# Patient Record
Sex: Male | Born: 1985 | Race: White | Hispanic: No | Marital: Married | Smoking: Never smoker
Health system: Southern US, Community
[De-identification: ages and names within clinical notes are randomized; demographics above are authoritative.]

## PROBLEM LIST (undated history)

## (undated) DIAGNOSIS — Z789 Other specified health status: Secondary | ICD-10-CM

---
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
## 2005-04-03 ENCOUNTER — Emergency Department (HOSPITAL_COMMUNITY): Admission: EM | Admit: 2005-04-03 | Discharge: 2005-04-03 | Payer: Self-pay | Admitting: Emergency Medicine

## 2009-10-05 ENCOUNTER — Emergency Department (HOSPITAL_COMMUNITY): Admission: EM | Admit: 2009-10-05 | Discharge: 2009-10-06 | Payer: Self-pay | Admitting: Emergency Medicine

## 2010-07-17 NOTE — Discharge Summary (Signed)
NAME:  Cody [name], Cody [name]                          ACCOUNT NO.:  [id number]   MEDICAL RECORD NO.:  [id number]                   PATIENT TYPE:  INP   LOCATION:  A307                                 FACILITY:  [facility]   PHYSICIAN:  Scott A. Gerda Diss, M.D.               DATE OF BIRTH:  1985-03-31   DATE OF ADMISSION:  08/09/2003  DATE OF DISCHARGE:                                 DISCHARGE SUMMARY   HOSPITAL COURSE:  The patient was brought into the hospital for outpatient  fluids.  This could not be done through the emergency department, so he was  put in observation. He was given IV fluids and had blood work done. His  blood work overall looked normal.  WBC was 6.5 and hemoglobin was 14.9.  Sodium 140, potassium 4.1, BUN 3.0 and the creatinine was 0.9.  The patient  was brought in for these fluids because of orthostasis secondary to 24 hours  of gastroenteritis.  It is felt that the patient would respond well to IV  fluids and in fact his overall clinical picture was so much better at the  time of discharge.  It was felt that he could home on Phenergan and he was  given a prescription for this.  Unfortunately, this short stay could not be  done through the ER because of their policy towards of not doing this type  of stuff, so therefore it was done here on the floor.     ___________________________________________                                         Jonna Coup Gerda Diss, M.D.   SAL/MEDQ  D:  08/09/2003  T:  08/10/2003  Job:  [id number]

## 2011-05-12 ENCOUNTER — Other Ambulatory Visit: Payer: Self-pay | Admitting: Neurosurgery

## 2011-05-14 ENCOUNTER — Encounter (HOSPITAL_COMMUNITY)
Admission: RE | Admit: 2011-05-14 | Discharge: 2011-05-14 | Disposition: A | Discharge status: Home / Self Care | Payer: BC Managed Care – PPO | Source: Ambulatory Visit | Attending: Neurosurgery | Admitting: Neurosurgery

## 2011-05-14 ENCOUNTER — Encounter (HOSPITAL_COMMUNITY): Payer: Self-pay | Admitting: Respiratory Therapy

## 2011-05-14 ENCOUNTER — Encounter (HOSPITAL_COMMUNITY): Payer: Self-pay

## 2011-05-14 HISTORY — DX: Other specified health status: Z78.9

## 2011-05-14 LAB — URINALYSIS, ROUTINE W REFLEX MICROSCOPIC
Bilirubin Urine: NEGATIVE
Hgb urine dipstick: NEGATIVE
Protein, ur: NEGATIVE mg/dL
Urobilinogen, UA: 0.2 mg/dL (ref 0.0–1.0)

## 2011-05-14 LAB — DIFFERENTIAL
Basophils Relative: 1 % (ref 0–1)
Eosinophils Absolute: 0.1 10*3/uL (ref 0.0–0.7)
Eosinophils Relative: 1 % (ref 0–5)
Monocytes Absolute: 0.8 10*3/uL (ref 0.1–1.0)
Monocytes Relative: 9 % (ref 3–12)

## 2011-05-14 LAB — CBC
HCT: 43.8 % (ref 39.0–52.0)
Hemoglobin: 15.2 g/dL (ref 13.0–17.0)
MCH: 29.5 pg (ref 26.0–34.0)
MCHC: 34.7 g/dL (ref 30.0–36.0)
MCV: 84.9 fL (ref 78.0–100.0)

## 2011-05-14 LAB — BASIC METABOLIC PANEL
BUN: 12 mg/dL (ref 6–23)
GFR calc non Af Amer: >90 mL/min (ref >90)
Glucose, Bld: 87 mg/dL (ref 70–99)
Potassium: 4.1 mEq/L (ref 3.5–5.1)

## 2011-05-14 NOTE — Pre-Procedure Instructions (Signed)
20 JAREE TRINKA  05/14/2011   Your procedure is scheduled on:  Tues,Mar 19 @ 2:50pm  Report to [facility] at 1145am   Call this number if you have problems the morning of surgery: [phone]   Remember:   Do not eat food:After Midnight.  May have clear liquids: up to 4 Hours before arrival.(until 7:45 am)  Clear liquids include soda, tea, black coffee, apple or grape juice, broth,water  Take these medicines the morning of surgery with A SIP OF WATER:    Do not wear jewelry, make-up or nail polish.  Do not wear lotions, powders, or perfumes.   Do not bring valuables to the hospital.  Contacts, dentures or bridgework may not be worn into surgery.  Leave suitcase in the car. After surgery it may be brought to your room.  For patients admitted to the hospital, checkout time is 11:00 AM the day of discharge.   Patients discharged the day of surgery will not be allowed to drive home.    Special Instructions: CHG Shower Use Special Wash: 1/2 bottle night before surgery and 1/2 bottle morning of surgery.   Please read over the following fact sheets that you were given: Pain Booklet, Coughing and Deep Breathing, MRSA Information and Surgical Site Infection Prevention

## 2011-05-14 NOTE — Pre-Procedure Instructions (Signed)
20 Cody [name]  05/14/2011   Your procedure is scheduled on: Tuesday, March 19,2013  Report to [facility] at 11:45AM.  Call this number if you have problems the morning of surgery: [phone]   Remember:   Do not eat food:After Midnight.  May have clear liquids: up to 4 Hours before arrival. (7:45)  Clear liquids include soda, tea, black coffee, apple or grape juice, broth.  Take these medicines the morning of surgery with A SIP OF WATER:  hydrocodone   Do not wear jewelry, make-up or nail polish.  Do not wear lotions, powders, or perfumes. You may wear deodorant.  Do not shave 48 hours prior to surgery.  Do not bring valuables to the hospital.  Contacts, dentures or bridgework may not be worn into surgery.  Leave suitcase in the car. After surgery it may be brought to your room.  For patients admitted to the hospital, checkout time is 11:00 AM the day of discharge.   Patients discharged the day of surgery will not be allowed to drive home.  Name and phone number of your driver: Orie Fisherman [phone]  Special Instructions: CHG Shower Use Special Wash: 1/2 bottle night before surgery and 1/2 bottle morning of surgery.   Please read over the following fact sheets that you were given: Pain Booklet, Coughing and Deep Breathing, MRSA Information and Surgical Site Infection Prevention

## 2011-05-17 MED ORDER — CEFAZOLIN SODIUM 1-5 GM-% IV SOLN
1.0000 g | INTRAVENOUS | Status: AC
  Administered 2011-05-18: 1 g via INTRAVENOUS
  Filled 2011-05-17: qty 50

## 2011-05-18 ENCOUNTER — Encounter (HOSPITAL_COMMUNITY)
Admission: RE | Disposition: A | Discharge status: Home / Self Care | Payer: Self-pay | Source: Ambulatory Visit | Attending: Neurosurgery

## 2011-05-18 ENCOUNTER — Ambulatory Visit (HOSPITAL_COMMUNITY)
Admission: RE | Admit: 2011-05-18 | Discharge: 2011-05-18 | Disposition: A | Discharge status: Home / Self Care | Payer: BC Managed Care – PPO | Source: Ambulatory Visit | Attending: Neurosurgery | Admitting: Neurosurgery

## 2011-05-18 ENCOUNTER — Encounter (HOSPITAL_COMMUNITY): Payer: Self-pay | Admitting: Anesthesiology

## 2011-05-18 ENCOUNTER — Ambulatory Visit (HOSPITAL_COMMUNITY): Payer: BC Managed Care – PPO

## 2011-05-18 ENCOUNTER — Encounter (HOSPITAL_COMMUNITY): Payer: Self-pay | Admitting: *Deleted

## 2011-05-18 ENCOUNTER — Ambulatory Visit (HOSPITAL_COMMUNITY): Payer: BC Managed Care – PPO | Admitting: Anesthesiology

## 2011-05-18 DIAGNOSIS — M5126 Other intervertebral disc displacement, lumbar region: Secondary | ICD-10-CM | POA: Insufficient documentation

## 2011-05-18 DIAGNOSIS — Z01812 Encounter for preprocedural laboratory examination: Secondary | ICD-10-CM | POA: Insufficient documentation

## 2011-05-18 HISTORY — PX: LUMBAR LAMINECTOMY/DECOMPRESSION MICRODISCECTOMY: SHX5026

## 2011-05-18 SURGERY — LUMBAR LAMINECTOMY/DECOMPRESSION MICRODISCECTOMY
Anesthesia: General | Site: Spine Lumbar | Laterality: Left | Wound class: Clean

## 2011-05-18 MED ORDER — EPHEDRINE SULFATE 50 MG/ML IJ SOLN
INTRAMUSCULAR | Status: DC | PRN
  Administered 2011-05-18: 10 mg via INTRAVENOUS

## 2011-05-18 MED ORDER — PROMETHAZINE HCL 25 MG PO TABS
12.5000 mg | ORAL_TABLET | ORAL | Status: DC | PRN
Start: 2011-05-18 — End: 2011-05-18

## 2011-05-18 MED ORDER — GLYCOPYRROLATE 0.2 MG/ML IJ SOLN
INTRAMUSCULAR | Status: DC | PRN
Start: 2011-05-18 — End: 2011-05-18
  Administered 2011-05-18: .4 mg via INTRAVENOUS

## 2011-05-18 MED ORDER — SODIUM CHLORIDE 0.9 % IV SOLN
INTRAVENOUS | Status: AC
  Filled 2011-05-18: qty 500

## 2011-05-18 MED ORDER — KETOROLAC TROMETHAMINE 30 MG/ML IJ SOLN
INTRAMUSCULAR | Status: AC
  Filled 2011-05-18: qty 1

## 2011-05-18 MED ORDER — ZOLPIDEM TARTRATE 5 MG PO TABS: 10.0000 mg | ORAL_TABLET | Freq: Every evening | ORAL | Status: DC | PRN

## 2011-05-18 MED ORDER — METHOCARBAMOL 500 MG PO TABS: 500.0000 mg | ORAL_TABLET | Freq: Four times a day (QID) | ORAL | Status: DC | PRN

## 2011-05-18 MED ORDER — MORPHINE SULFATE 4 MG/ML IJ SOLN: 1.0000 mg | INTRAMUSCULAR | Status: DC | PRN

## 2011-05-18 MED ORDER — DOCUSATE SODIUM 100 MG PO CAPS: 100.0000 mg | ORAL_CAPSULE | Freq: Two times a day (BID) | ORAL | Status: DC

## 2011-05-18 MED ORDER — CEFAZOLIN SODIUM 1-5 GM-% IV SOLN
1.0000 g | Freq: Three times a day (TID) | INTRAVENOUS | Status: DC
  Filled 2011-05-18 (×2): qty 50

## 2011-05-18 MED ORDER — SODIUM CHLORIDE 0.9 % IJ SOLN: 3.0000 mL | Freq: Two times a day (BID) | INTRAMUSCULAR | Status: DC

## 2011-05-18 MED ORDER — KCL IN DEXTROSE-NACL 20-5-0.45 MEQ/L-%-% IV SOLN
INTRAVENOUS | Status: DC
  Filled 2011-05-18 (×2): qty 1000

## 2011-05-18 MED ORDER — LACTATED RINGERS IV SOLN
INTRAVENOUS | Status: DC | PRN
  Administered 2011-05-18: 13:00:00 via INTRAVENOUS

## 2011-05-18 MED ORDER — HYDROMORPHONE HCL PF 1 MG/ML IJ SOLN
0.2500 mg | INTRAMUSCULAR | Status: DC | PRN
  Administered 2011-05-18 (×2): 0.5 mg via INTRAVENOUS

## 2011-05-18 MED ORDER — PROPOFOL 10 MG/ML IV BOLUS
INTRAVENOUS | Status: DC | PRN
  Administered 2011-05-18: 200 mg via INTRAVENOUS

## 2011-05-18 MED ORDER — DEXTROSE 5 % IV SOLN
500.0000 mg | Freq: Four times a day (QID) | INTRAVENOUS | Status: DC | PRN
  Filled 2011-05-18: qty 5

## 2011-05-18 MED ORDER — DEXAMETHASONE SODIUM PHOSPHATE 4 MG/ML IJ SOLN
INTRAMUSCULAR | Status: DC | PRN
  Administered 2011-05-18: 10 mg via INTRAVENOUS

## 2011-05-18 MED ORDER — OXYCODONE-ACETAMINOPHEN 5-325 MG PO TABS
1.0000 | ORAL_TABLET | ORAL | Status: DC | PRN
  Administered 2011-05-18: 2 via ORAL
  Filled 2011-05-18: qty 2

## 2011-05-18 MED ORDER — HYDROMORPHONE HCL PF 1 MG/ML IJ SOLN
INTRAMUSCULAR | Status: AC
  Filled 2011-05-18: qty 1

## 2011-05-18 MED ORDER — 0.9 % SODIUM CHLORIDE (POUR BTL) OPTIME
TOPICAL | Status: DC | PRN
  Administered 2011-05-18: 1000 mL

## 2011-05-18 MED ORDER — ONDANSETRON HCL 4 MG/2ML IJ SOLN: 4.0000 mg | INTRAMUSCULAR | Status: DC | PRN

## 2011-05-18 MED ORDER — HYDROCODONE-ACETAMINOPHEN 5-325 MG PO TABS: 1.0000 | ORAL_TABLET | ORAL | Status: DC | PRN

## 2011-05-18 MED ORDER — BACITRACIN 50000 UNITS IM SOLR
INTRAMUSCULAR | Status: AC
  Filled 2011-05-18: qty 1

## 2011-05-18 MED ORDER — LIDOCAINE-EPINEPHRINE 1 %-1:100000 IJ SOLN
INTRAMUSCULAR | Status: DC | PRN
  Administered 2011-05-18: 20 mL

## 2011-05-18 MED ORDER — BISACODYL 10 MG RE SUPP: 10.0000 mg | Freq: Every day | RECTAL | Status: DC | PRN

## 2011-05-18 MED ORDER — MAGNESIUM HYDROXIDE 400 MG/5ML PO SUSP: 30.0000 mL | Freq: Every day | ORAL | Status: DC | PRN

## 2011-05-18 MED ORDER — ACETAMINOPHEN 650 MG RE SUPP: 650.0000 mg | RECTAL | Status: DC | PRN

## 2011-05-18 MED ORDER — KETOROLAC TROMETHAMINE 30 MG/ML IJ SOLN
30.0000 mg | Freq: Four times a day (QID) | INTRAMUSCULAR | Status: DC
  Administered 2011-05-18: 30 mg via INTRAVENOUS

## 2011-05-18 MED ORDER — SODIUM CHLORIDE 0.9 % IJ SOLN: 3.0000 mL | INTRAMUSCULAR | Status: DC | PRN

## 2011-05-18 MED ORDER — SODIUM CHLORIDE 0.9 % IR SOLN
Status: DC | PRN
  Administered 2011-05-18: 14:00:00

## 2011-05-18 MED ORDER — ROCURONIUM BROMIDE 100 MG/10ML IV SOLN
INTRAVENOUS | Status: DC | PRN
  Administered 2011-05-18: 50 mg via INTRAVENOUS

## 2011-05-18 MED ORDER — MIDAZOLAM HCL 5 MG/5ML IJ SOLN
INTRAMUSCULAR | Status: DC | PRN
  Administered 2011-05-18: 2 mg via INTRAVENOUS

## 2011-05-18 MED ORDER — NEOSTIGMINE METHYLSULFATE 1 MG/ML IJ SOLN
INTRAMUSCULAR | Status: DC | PRN
  Administered 2011-05-18: 3 mg via INTRAVENOUS

## 2011-05-18 MED ORDER — FENTANYL CITRATE 0.05 MG/ML IJ SOLN
INTRAMUSCULAR | Status: DC | PRN
  Administered 2011-05-18: 250 ug via INTRAVENOUS

## 2011-05-18 MED ORDER — ONDANSETRON HCL 4 MG/2ML IJ SOLN: 4.0000 mg | Freq: Once | INTRAMUSCULAR | Status: DC | PRN

## 2011-05-18 MED ORDER — THROMBIN 5000 UNITS EX SOLR
CUTANEOUS | Status: DC | PRN
  Administered 2011-05-18 (×2): 5000 [IU] via TOPICAL

## 2011-05-18 MED ORDER — ONDANSETRON HCL 4 MG/2ML IJ SOLN
INTRAMUSCULAR | Status: DC | PRN
  Administered 2011-05-18: 4 mg via INTRAVENOUS

## 2011-05-18 MED ORDER — HEMOSTATIC AGENTS (NO CHARGE) OPTIME
TOPICAL | Status: DC | PRN
  Administered 2011-05-18: 1 via TOPICAL

## 2011-05-18 MED ORDER — CYCLOBENZAPRINE HCL 10 MG PO TABS: 10.0000 mg | ORAL_TABLET | Freq: Three times a day (TID) | ORAL | Status: DC | PRN

## 2011-05-18 MED ORDER — PROMETHAZINE HCL 25 MG/ML IJ SOLN: 12.5000 mg | INTRAMUSCULAR | Status: DC | PRN

## 2011-05-18 MED ORDER — ACETAMINOPHEN 325 MG PO TABS: 650.0000 mg | ORAL_TABLET | ORAL | Status: DC | PRN

## 2011-05-18 SURGICAL SUPPLY — 54 items
APL SKNCLS STERI-STRIP NONHPOA (GAUZE/BANDAGES/DRESSINGS) ×1
BAG DECANTER FOR FLEXI CONT (MISCELLANEOUS) ×2 IMPLANT
BENZOIN TINCTURE PRP APPL 2/3 (GAUZE/BANDAGES/DRESSINGS) ×2 IMPLANT
BLADE SURG ROTATE 9660 (MISCELLANEOUS) IMPLANT
BUR ACORN 6.0 ACORN (BURR) ×2 IMPLANT
BUR ACRON 5.0MM COATED (BURR) IMPLANT
CANISTER SUCTION 2500CC (MISCELLANEOUS) ×2 IMPLANT
CLOTH BEACON ORANGE TIMEOUT ST (SAFETY) ×2 IMPLANT
CONT SPEC 4OZ CLIKSEAL STRL BL (MISCELLANEOUS) IMPLANT
DRAPE LAPAROTOMY 100X72X124 (DRAPES) ×2 IMPLANT
DRAPE MICROSCOPE LEICA (MISCELLANEOUS) ×2 IMPLANT
DRAPE POUCH INSTRU U-SHP 10X18 (DRAPES) ×2 IMPLANT
DRAPE SURG 17X23 STRL (DRAPES) ×2 IMPLANT
DRESSING TELFA 8X3 (GAUZE/BANDAGES/DRESSINGS) ×2 IMPLANT
DURAPREP 26ML APPLICATOR (WOUND CARE) ×2 IMPLANT
ELECT REM PT RETURN 9FT ADLT (ELECTROSURGICAL) ×2
ELECTRODE REM PT RTRN 9FT ADLT (ELECTROSURGICAL) ×1 IMPLANT
GAUZE SPONGE 4X4 16PLY XRAY LF (GAUZE/BANDAGES/DRESSINGS) IMPLANT
GLOVE BIOGEL PI IND STRL 7.0 (GLOVE) ×1 IMPLANT
GLOVE BIOGEL PI INDICATOR 7.0 (GLOVE) ×1
GLOVE ECLIPSE 7.5 STRL STRAW (GLOVE) ×2 IMPLANT
GLOVE EXAM NITRILE LRG STRL (GLOVE) IMPLANT
GLOVE EXAM NITRILE MD LF STRL (GLOVE) IMPLANT
GLOVE EXAM NITRILE XL STR (GLOVE) IMPLANT
GLOVE EXAM NITRILE XS STR PU (GLOVE) IMPLANT
GLOVE SURG SS PI 6.5 STRL IVOR (GLOVE) ×4 IMPLANT
GOWN BRE IMP SLV AUR LG STRL (GOWN DISPOSABLE) ×4 IMPLANT
GOWN BRE IMP SLV AUR XL STRL (GOWN DISPOSABLE) ×2 IMPLANT
GOWN STRL REIN 2XL LVL4 (GOWN DISPOSABLE) IMPLANT
KIT BASIN OR (CUSTOM PROCEDURE TRAY) ×2 IMPLANT
KIT ROOM TURNOVER OR (KITS) ×2 IMPLANT
NEEDLE HYPO 18GX1.5 BLUNT FILL (NEEDLE) IMPLANT
NEEDLE HYPO 22GX1.5 SAFETY (NEEDLE) ×4 IMPLANT
NEEDLE SPNL 22GX3.5 QUINCKE BK (NEEDLE) ×2 IMPLANT
NS IRRIG 1000ML POUR BTL (IV SOLUTION) ×2 IMPLANT
PACK LAMINECTOMY NEURO (CUSTOM PROCEDURE TRAY) ×2 IMPLANT
PAD ARMBOARD 7.5X6 YLW CONV (MISCELLANEOUS) ×6 IMPLANT
PATTIES SURGICAL .75X.75 (GAUZE/BANDAGES/DRESSINGS) ×2 IMPLANT
RUBBERBAND STERILE (MISCELLANEOUS) ×4 IMPLANT
SPONGE GAUZE 4X4 12PLY (GAUZE/BANDAGES/DRESSINGS) ×2 IMPLANT
SPONGE LAP 4X18 X RAY DECT (DISPOSABLE) IMPLANT
SPONGE SURGIFOAM ABS GEL SZ50 (HEMOSTASIS) ×2 IMPLANT
STRIP CLOSURE SKIN 1/2X4 (GAUZE/BANDAGES/DRESSINGS) ×2 IMPLANT
SUT PROLENE 6 0 BV (SUTURE) IMPLANT
SUT VIC AB 0 CT1 18XCR BRD8 (SUTURE) ×1 IMPLANT
SUT VIC AB 0 CT1 8-18 (SUTURE) ×1
SUT VIC AB 2-0 CP2 18 (SUTURE) ×2 IMPLANT
SUT VIC AB 3-0 SH 8-18 (SUTURE) ×2 IMPLANT
SYR 20CC LL (SYRINGE) ×2 IMPLANT
SYR 5ML LL (SYRINGE) IMPLANT
TAPE CLOTH SURG 4X10 WHT LF (GAUZE/BANDAGES/DRESSINGS) ×2 IMPLANT
TOWEL OR 17X24 6PK STRL BLUE (TOWEL DISPOSABLE) ×2 IMPLANT
TOWEL OR 17X26 10 PK STRL BLUE (TOWEL DISPOSABLE) ×2 IMPLANT
WATER STERILE IRR 1000ML POUR (IV SOLUTION) ×2 IMPLANT

## 2011-05-18 NOTE — Anesthesia Preprocedure Evaluation (Signed)
Anesthesia Evaluation  Patient identified by MRN, date of birth, ID band Patient awake    Reviewed: Allergy & Precautions, H&P , NPO status , Patient's Chart, lab work & pertinent test results  Airway Mallampati: II      Dental  (+) Teeth Intact   Pulmonary  breath sounds clear to auscultation        Cardiovascular Rhythm:Regular Rate:Normal     Neuro/Psych    GI/Hepatic   Endo/Other    Renal/GU      Musculoskeletal   Abdominal   Peds  Hematology   Anesthesia Other Findings   Reproductive/Obstetrics                           Anesthesia Physical Anesthesia Plan  ASA: I  Anesthesia Plan: General   Post-op Pain Management:    Induction: Intravenous  Airway Management Planned: Oral ETT  Additional Equipment:   Intra-op Plan:   Post-operative Plan: Extubation in OR  Informed Consent: I have reviewed the patients History and Physical, chart, labs and discussed the procedure including the risks, benefits and alternatives for the proposed anesthesia with the patient or authorized representative who has indicated his/her understanding and acceptance.     Plan Discussed with: Surgeon and CRNA  Anesthesia Plan Comments: (HNP L5-S1)        Anesthesia Quick Evaluation

## 2011-05-18 NOTE — Interval H&P Note (Signed)
History and Physical Interval Note:  05/18/2011 12:07 PM  Cody [name]  has presented today for surgery, with the diagnosis of Lumbar hnp without myelopathy, Lumbar radiculopathy  The various methods of treatment have been discussed with the patient and family. After consideration of risks, benefits and other options for treatment, the patient has consented to  Procedure(s) (LRB): LUMBAR LAMINECTOMY/DECOMPRESSION MICRODISCECTOMY (Left) as a surgical intervention .  The patients' history has been reviewed, patient examined, no change in status, stable for surgery.  I have reviewed the patients' chart and labs.  Questions were answered to the patient's satisfaction.     [name] R

## 2011-05-18 NOTE — Discharge Summary (Signed)
Physician Discharge Summary  Patient ID: Cody [name] MRN: [id number] DOB/AGE: 1985/09/29 25 y.o.  Admit date: 05/18/2011 Discharge date: 05/18/2011  Admission Diagnoses:Lumbar herniated nucleus pulposus, Lumbar radiculopathy Left L5S1   Discharge Diagnoses: Lumbar herniated nucleus pulposus, Lumbar radiculopathy Left L5S1  Active Problems:  * No active hospital problems. *    Discharged Condition: good  Hospital Course: Patient admitted the day of surgery and underwent procedure below. Patient transferred from none to the floor up ambulating doing well less leg pain.  Consults: None  Significant Diagnostic Studies: none  Treatments: surgery: LUMBAR LAMINECTOMY/DECOMPRESSION MICRODISCECTOMY, Left L5S1 , microdisection   Discharge Exam: Blood pressure 116/77, pulse 84, temperature 98.3 F (36.8 C), temperature source Oral, resp. rate 18, SpO2 97.00%. Wound:c/d/i  Disposition: home   Medication List  As of 05/18/2011  2:28 PM   TAKE these medications         diclofenac 75 MG EC tablet   Commonly known as: VOLTAREN   Take 75 mg by mouth 2 (two) times daily.      HYDROcodone-acetaminophen 5-325 MG per tablet   Commonly known as: NORCO   Take 1 tablet by mouth every 6 (six) hours as needed. For pain      multivitamins ther. w/minerals Tabs   Take 1 tablet by mouth daily.             Signed: Clydene Fake, MD 05/18/2011, 2:28 PM

## 2011-05-18 NOTE — Anesthesia Postprocedure Evaluation (Signed)
  Anesthesia Post-op Note  Patient: Cody [name]  Procedure(s) Performed: Procedure(s) (LRB): LUMBAR LAMINECTOMY/DECOMPRESSION MICRODISCECTOMY (Left)  Patient Location: PACU  Anesthesia Type: General  Level of Consciousness: awake, alert  and oriented  Airway and Oxygen Therapy: Patient Spontanous Breathing and Patient connected to nasal cannula oxygen  Post-op Pain: mild  Post-op Assessment: Post-op Vital signs reviewed and Patient's Cardiovascular Status Stable  Post-op Vital Signs: stable  Complications: No apparent anesthesia complications

## 2011-05-18 NOTE — Op Note (Signed)
05/18/2011  2:22 PM  PATIENT:  Cody [name]  26 y.o. male  PRE-OPERATIVE DIAGNOSIS:  Lumbar herniated nucleus pulposus, Lumbar radiculopathy Left L5S1  POST-OPERATIVE DIAGNOSIS: same  PROCEDURE:  Procedure(s): LUMBAR LAMINECTOMY/DECOMPRESSION MICRODISCECTOMY, Left L5S1 , microdisection  SURGEON:  Surgeon(s): Clydene Fake, MD Karn Cassis, MD    ANESTHESIA:   general  EBL:   minimal  BLOOD ADMINISTERED:none  DRAINS: none   SPECIMEN:  No Specimen  DICTATION: Patient with back and left leg pain MRI showing large left-sided 51 disc herniation patient epidural injection which  helped but only lasted 3 days and patient decided to proceed with surgical intervention.  Patient brought into the operating room general anesthesia induced patient placed in a prone position with all pressure points padded the Wilson frame. Patient was prepped draped sterile fashion saber incision injected with 20 cc 1% lidocaine with epinephrine. He was placed in interspace x-rays obtained showing needles 0.51 interspace an incision was made centered with needle was incision taken the fascia hemostasis obtained with Bovie cauterization. Fascia was incised the Bovie and subperiosteal dissection done with L5 and S1 spinous process lamina to the facet something retractors placed markers placed in the interspace another x-ray was obtained confirming or positioning at L5-S1. Microscope was brought in for microdissection high-speed drill was used to starting hemilaminectomy medial facetectomy is completed with Kerrison punches foraminotomy was done over the S1 root. We then explored the epidural space a large disc herniation was seen under the nerve root disc space incised and discectomy done with pituitary rongeurs and curettes. We were finished we did decompression of the thecal sac and the S1 nerve root which bone fibered at its canal without compression. We are thick about solution we did hemostasis retractors  removed fascia closed with 0 Vicryl interrupted sutures subcutaneous tissue closed with 020 3-0 Vicryl interrupted sutures skin closed benzoin Steri-Strips dressing was placed patient placed back in supine position woken from anesthesia and transferred recovery room  PLAN OF CARE: Admit for overnight observation  PATIENT DISPOSITION:  PACU - hemodynamically stable.

## 2011-05-18 NOTE — Anesthesia Procedure Notes (Signed)
Procedure Name: Intubation Date/Time: 05/18/2011 12:54 PM Performed by: Marena Chancy Pre-anesthesia Checklist: Patient identified, Emergency Drugs available, Suction available and Patient being monitored Patient Re-evaluated:Patient Re-evaluated prior to inductionOxygen Delivery Method: Circle system utilized Preoxygenation: Pre-oxygenation with 100% oxygen Intubation Type: IV induction Ventilation: Mask ventilation without difficulty Laryngoscope Size: Miller and 2 Grade View: Grade I Number of attempts: 1 Secured at: 23 cm Tube secured with: Tape Dental Injury: Teeth and Oropharynx as per pre-operative assessment

## 2011-05-18 NOTE — Progress Notes (Signed)
Pt. Discharged as ordered. Pt. tolertating po pain meds. Ambulating and voiding adequate amt.of urine. Incision area CDI,with no S/S of infection. Discharged care notes, script,and instructions given to patient. Pt. And family stated understanding of instruction given.

## 2011-05-18 NOTE — Transfer of Care (Signed)
Immediate Anesthesia Transfer of Care Note  Patient: Cody [name]  Procedure(s) Performed: Procedure(s) (LRB): LUMBAR LAMINECTOMY/DECOMPRESSION MICRODISCECTOMY (Left)  Patient Location: PACU  Anesthesia Type: General  Level of Consciousness: awake, alert  and oriented  Airway & Oxygen Therapy: Patient Spontanous Breathing and Patient connected to nasal cannula oxygen  Post-op Assessment: Report given to PACU RN, Post -op Vital signs reviewed and stable and Patient moving all extremities X 4  Post vital signs: Reviewed and stable  Complications: No apparent anesthesia complications

## 2011-05-18 NOTE — H&P (Signed)
See H& P.

## 2011-05-18 NOTE — Preoperative (Signed)
Beta Blockers   Reason not to administer Beta Blockers:Not Applicable 

## 2011-05-19 ENCOUNTER — Encounter (HOSPITAL_COMMUNITY): Payer: Self-pay | Admitting: Neurosurgery

## 2012-09-23 ENCOUNTER — Encounter (HOSPITAL_COMMUNITY): Payer: Self-pay | Admitting: Emergency Medicine

## 2012-09-23 ENCOUNTER — Emergency Department (HOSPITAL_COMMUNITY)
Admission: EM | Admit: 2012-09-23 | Discharge: 2012-09-23 | Disposition: A | Discharge status: Home / Self Care | Payer: BC Managed Care – PPO | Attending: Emergency Medicine | Admitting: Emergency Medicine

## 2012-09-23 DIAGNOSIS — Z791 Long term (current) use of non-steroidal anti-inflammatories (NSAID): Secondary | ICD-10-CM | POA: Insufficient documentation

## 2012-09-23 DIAGNOSIS — S90569A Insect bite (nonvenomous), unspecified ankle, initial encounter: Secondary | ICD-10-CM | POA: Insufficient documentation

## 2012-09-23 DIAGNOSIS — W57XXXA Bitten or stung by nonvenomous insect and other nonvenomous arthropods, initial encounter: Secondary | ICD-10-CM

## 2012-09-23 DIAGNOSIS — L509 Urticaria, unspecified: Secondary | ICD-10-CM | POA: Insufficient documentation

## 2012-09-23 DIAGNOSIS — Y939 Activity, unspecified: Secondary | ICD-10-CM | POA: Insufficient documentation

## 2012-09-23 DIAGNOSIS — Y929 Unspecified place or not applicable: Secondary | ICD-10-CM | POA: Insufficient documentation

## 2012-09-23 DIAGNOSIS — Z79899 Other long term (current) drug therapy: Secondary | ICD-10-CM | POA: Insufficient documentation

## 2012-09-23 MED ORDER — DOXYCYCLINE HYCLATE 100 MG PO CAPS: 100.0000 mg | ORAL_CAPSULE | Freq: Two times a day (BID) | ORAL | Status: DC

## 2012-09-23 NOTE — ED Notes (Signed)
Pt c/o hives since Sunday, went to a [facility] and received prescription for Prednisone 10mg . Today was last day on medication but hives have not subsided. States they look worse than before. Pt pulled tick off of left calf on Monday, area is red and swollen, reports having pain "like shin splints" on bilateral extremities.

## 2012-09-23 NOTE — ED Notes (Signed)
MD at bedside. 

## 2012-09-23 NOTE — ED Provider Notes (Signed)
CSN: [id number]     Arrival date & time 09/23/12  0202 History     First MD Initiated Contact with Patient 09/23/12 0250     Chief Complaint  Patient presents with  . Urticaria  . Tick Removal   (Consider location/radiation/quality/duration/timing/severity/associated sxs/prior Treatment) HPI HPI Comments: Cody [name] is a 27 y.o. male who presents to the Emergency Department complaining of hives that began on Sunday. He was seen at a [facility] and placed on prednisone which he finished yesterday. This morning he woke up with hives. On Monday he was bitten by a small wood tick. He has no area of redness, fever, chills, rash from the tick bite.   PCP Dr. Luking  Past Medical History  Diagnosis Date  . No pertinent past medical history    Past Surgical History  Procedure Laterality Date  . Lumbar laminectomy/decompression microdiscectomy  05/18/2011    Procedure: LUMBAR LAMINECTOMY/DECOMPRESSION MICRODISCECTOMY;  Surgeon: James R Hirsch, MD;  Location: [facility];  Service: Neurosurgery;  Laterality: Left;  Left Lumbar Five-Sacral One Laminectomy/Diskectomy   No family history on file. History  Substance Use Topics  . Smoking status: Not on file  . Smokeless tobacco: Current User  . Alcohol Use: Yes     Comment: occasionally    Review of Systems  Constitutional: Negative for fever.       10  Systems reviewed and are negative for acute change except as noted in the HPI.  HENT: Negative for congestion.   Eyes: Negative for discharge and redness.  Respiratory: Negative for cough and shortness of breath.   Cardiovascular: Negative for chest pain.  Gastrointestinal: Negative for vomiting and abdominal pain.  Musculoskeletal: Negative for back pain.  Skin: Negative for rash.       Hives, tick bite  Neurological: Negative for syncope, numbness and headaches.  Psychiatric/Behavioral:       No behavior change.    Allergies  Bee venom  Home Medications   Current  Outpatient Rx  Name  Route  Sig  Dispense  Refill  . diclofenac (VOLTAREN) 75 MG EC tablet   Oral   Take 75 mg by mouth 2 (two) times daily.         Marland Kitchen HYDROcodone-acetaminophen (NORCO) 5-325 MG per tablet   Oral   Take 1 tablet by mouth every 6 (six) hours as needed. For pain         . Multiple Vitamins-Minerals (MULTIVITAMINS THER. W/MINERALS) TABS   Oral   Take 1 tablet by mouth daily.          BP 143/99  Pulse 78  Temp(Src) 98.3 F (36.8 C) (Oral)  Resp 16  Ht 5\' 8"  (1.727 m)  Wt 160 lb (72.576 kg)  BMI 24.33 kg/m2  SpO2 100% Physical Exam  Nursing note and vitals reviewed. Constitutional:  Awake, alert, nontoxic appearance.  HENT:  Head: Atraumatic.  Eyes: Right eye exhibits no discharge. Left eye exhibits no discharge.  Neck: Neck supple.  Pulmonary/Chest: Effort normal. He exhibits no tenderness.  Abdominal: Soft. There is no tenderness. There is no rebound.  Musculoskeletal: He exhibits no tenderness.  Baseline ROM, no obvious new focal weakness.  Neurological:  Mental status and motor strength appears baseline for patient and situation.  Skin: No rash noted.  Diffuse hives to arms, legs, waist, chest. Small tick bite area to ankle on right. No erythema, swelling, drainage noted.  Psychiatric: He has a normal mood and affect.    ED Course  Procedures (including critical care time)    MDM  Patient with a general allergic reaction characterized by hives since Sunday. He has finished his prednisone. He also has a tick bite on his right ankle. Will treat him with doxycyline. Pt stable in ED with no significant deterioration in condition.The patient appears reasonably screened and/or stabilized for discharge and I doubt any other medical condition or other [facility] requiring further screening, evaluation, or treatment in the ED at this time prior to discharge.  MDM Reviewed: nursing note and vitals     Nicoletta Dress. Colon Branch, MD 09/23/12 [phone]

## 2013-11-15 ENCOUNTER — Ambulatory Visit: Payer: Self-pay | Admitting: Family Medicine

## 2015-12-02 ENCOUNTER — Encounter: Payer: Self-pay | Admitting: Family Medicine

## 2015-12-02 ENCOUNTER — Other Ambulatory Visit: Payer: Self-pay

## 2015-12-02 ENCOUNTER — Ambulatory Visit (INDEPENDENT_AMBULATORY_CARE_PROVIDER_SITE_OTHER): Payer: BLUE CROSS/BLUE SHIELD | Admitting: Family Medicine

## 2015-12-02 VITALS — BP 124/82 | Ht 68.0 in | Wt 160.0 lb

## 2015-12-02 DIAGNOSIS — L03116 Cellulitis of left lower limb: Secondary | ICD-10-CM

## 2015-12-02 DIAGNOSIS — R600 Localized edema: Secondary | ICD-10-CM | POA: Diagnosis not present

## 2015-12-02 DIAGNOSIS — L559 Sunburn, unspecified: Secondary | ICD-10-CM

## 2015-12-02 MED ORDER — CEPHALEXIN 500 MG PO CAPS: 500.0000 mg | ORAL_CAPSULE | Freq: Four times a day (QID) | ORAL | 0 refills | Status: DC

## 2015-12-02 MED ORDER — PREDNISONE 20 MG PO TABS
ORAL_TABLET | ORAL | 0 refills | Status: DC
Start: 2015-12-02 — End: 2015-12-02

## 2015-12-02 MED ORDER — PREDNISONE 20 MG PO TABS: ORAL_TABLET | ORAL | 0 refills | Status: DC

## 2015-12-02 NOTE — Progress Notes (Signed)
   Subjective:    Patient ID: Cody [name], Cody [name]    DOB: 07/21/1985, 30 y.o.   MRN: [id number]  HPI Patient with c/o fluid on both legs and painful to stand -noticed when he was on a fishing trip and did get sun burned The patient had a severe sunburn on both sides cause swelling in the legs but now he has soreness and tenderness around the ankle along with some swelling and redness still has redness from the sunburn. Never blistered from it. Has never had a thing like this before but typically does not get much sun. He was on a fishing trip when this occurred. It got worse over the past couple days. Denies any fever.  Review of Systems See above.    Objective:   Physical Exam Patient was sunburn on both lower extremities has a little bit of edema in the right ankle region has significant edema on the left ankle region in addition to this has some increased redness around the lateral aspect of the ankle and some mild tenderness       Assessment & Plan:  More than likely this is all related to sunburn and secondary pedal edema from vasodilation I recommend short prednisone taper there is a possibility of a cellulitis with the left lower extremity I recommend antibiotic over the next 7-10 days 4 times daily if not improving over the next 2-3 days or if getting worse I recommend recheck. Certainly call if any fevers so we can recheck go to ER if any emergency

## 2017-08-24 ENCOUNTER — Ambulatory Visit: Payer: Managed Care, Other (non HMO) | Admitting: Family Medicine

## 2017-08-24 ENCOUNTER — Encounter: Payer: Self-pay | Admitting: Family Medicine

## 2017-08-24 VITALS — BP 132/80 | Ht 68.0 in | Wt 166.0 lb

## 2017-08-24 DIAGNOSIS — M79604 Pain in right leg: Secondary | ICD-10-CM

## 2017-08-24 MED ORDER — ETODOLAC 400 MG PO TABS
ORAL_TABLET | ORAL | 1 refills | Status: DC
Start: 2017-08-24 — End: 2021-07-10

## 2017-08-24 MED ORDER — TIZANIDINE HCL 4 MG PO TABS: ORAL_TABLET | ORAL | 1 refills | Status: DC

## 2017-08-24 NOTE — Progress Notes (Signed)
   Subjective:    Patient ID: Cody CritchleyJoshua M [name], male    DOB: 04/22/1985, 32 y.o.   MRN: [id number]  HPI Pt here today for a pulled muscle in leg. Pt states he felt a "catch" in his leg on 08/22/17; it went away but came back. States the pain is in his upper leg. Hard to walk,stand, or sit.  Noted right post thigh and buttock tendersness four to five d ago  Went away at first and now more severe  Feels lkies  A constant pain    Only hurts with movement  Recalls no sudden injury but does eay wrokk   No otc meds  Review of Systems No headache, no major weight loss or weight gain, no chest pain no back pain abdominal pain no change in bowel habits complete ROS otherwise negative     Objective:   Physical Exam  Alert vitals stable, NAD. Blood pressure good on repeat. HEENT normal. Lungs clear. Heart regular rate and rhythm.   Substantial right posterior buttock pain to deep palpation.  No obvious deformity no warmth no erythema some superior posterior thigh pain to palpation also.      Assessment & Plan:  Impression deep upper hamstring strain most likely diagnosis discussed with family x-rays and scans really want help in this regard anti-inflammatory medicine prescribed muscle spasm medicine prescribed expect improvement warning signs discussed

## 2018-02-28 ENCOUNTER — Encounter: Payer: Self-pay | Admitting: Family Medicine

## 2018-02-28 ENCOUNTER — Ambulatory Visit: Payer: Managed Care, Other (non HMO) | Admitting: Family Medicine

## 2018-02-28 VITALS — BP 122/80 | Temp 98.9°F | Ht 68.0 in | Wt 171.0 lb

## 2018-02-28 DIAGNOSIS — J019 Acute sinusitis, unspecified: Secondary | ICD-10-CM

## 2018-02-28 DIAGNOSIS — B9689 Other specified bacterial agents as the cause of diseases classified elsewhere: Secondary | ICD-10-CM

## 2018-02-28 MED ORDER — AMOXICILLIN-POT CLAVULANATE 875-125 MG PO TABS: 1.0000 | ORAL_TABLET | Freq: Two times a day (BID) | ORAL | 0 refills | Status: DC

## 2018-02-28 NOTE — Progress Notes (Signed)
   Subjective:    Patient ID: Cody CritchleyJoshua M [name], male    DOB: 03/09/1985, 32 y.o.   MRN: [id number]  Sinusitis  This is a new problem. Episode onset: one week ago. Associated symptoms include congestion, coughing, ear pain, shortness of breath and a sore throat. Pertinent negatives include no chills. Treatments tried: mortin, claritin.   Significant sinus congestion drainage coughing sinus pressure not feeling good also had some low-grade fever sweats and chills for the first few days but that seems to be getting better no wheezing or difficulty breathing no vomiting or diarrhea except when he coughs a lot he does throw up some   Review of Systems  Constitutional: Negative for activity change, chills and fever.  HENT: Positive for congestion, ear pain, rhinorrhea and sore throat.   Eyes: Negative for discharge.  Respiratory: Positive for cough and shortness of breath. Negative for wheezing.   Cardiovascular: Negative for chest pain.  Gastrointestinal: Negative for nausea and vomiting.  Musculoskeletal: Negative for arthralgias.       Objective:   Physical Exam Vitals signs and nursing note reviewed.  Constitutional:      Appearance: He is well-developed.  HENT:     Head: Normocephalic.     Mouth/Throat:     Pharynx: No oropharyngeal exudate.  Neck:     Musculoskeletal: Normal range of motion.  Cardiovascular:     Rate and Rhythm: Normal rate and regular rhythm.     Heart sounds: Normal heart sounds. No murmur.  Pulmonary:     Effort: Pulmonary effort is normal.     Breath sounds: Normal breath sounds. No wheezing.  Lymphadenopathy:     Cervical: No cervical adenopathy.  Skin:    General: Skin is warm and dry.  Neurological:     Motor: No abnormal muscle tone.           Assessment & Plan:  More than likely had a mild viral illness possible mild case of the flu seemingly getting better now  Secondary rhinosinusitis antibiotics prescribed warning signs discussed  follow-up if progressive troubles or if worse  Warning signs regarding pneumonia were discussed with the patient call us or follow-up if any problems or ER or urgent care

## 2021-04-02 ENCOUNTER — Telehealth: Payer: Self-pay | Admitting: Family Medicine

## 2021-04-02 DIAGNOSIS — Z131 Encounter for screening for diabetes mellitus: Secondary | ICD-10-CM

## 2021-04-02 DIAGNOSIS — Z130 Encounter for screening for diseases of the blood and blood-forming organs and certain disorders involving the immune mechanism: Secondary | ICD-10-CM

## 2021-04-02 DIAGNOSIS — Z1159 Encounter for screening for other viral diseases: Secondary | ICD-10-CM

## 2021-04-02 DIAGNOSIS — Z114 Encounter for screening for human immunodeficiency virus [HIV]: Secondary | ICD-10-CM

## 2021-04-02 DIAGNOSIS — Z13220 Encounter for screening for lipoid disorders: Secondary | ICD-10-CM

## 2021-04-02 NOTE — Telephone Encounter (Signed)
Lipid, liver, metabolic 7, CBC, HIV antibody, hep C antibody Screening per CDC guidelines, wellness

## 2021-04-02 NOTE — Telephone Encounter (Signed)
Blood work ordered in Epic. Telephone call-mailbox is full 

## 2021-04-02 NOTE — Telephone Encounter (Signed)
Patient is having physical on 05/01/21 and needing labs done

## 2021-04-07 NOTE — Telephone Encounter (Signed)
Attempted to contact patient; voicemail is full  

## 2021-04-08 NOTE — Telephone Encounter (Signed)
Patient notified

## 2021-04-17 ENCOUNTER — Telehealth: Payer: Managed Care, Other (non HMO) | Admitting: Emergency Medicine

## 2021-04-17 DIAGNOSIS — H109 Unspecified conjunctivitis: Secondary | ICD-10-CM

## 2021-04-17 MED ORDER — POLYMYXIN B-TRIMETHOPRIM 10000-0.1 UNIT/ML-% OP SOLN: 1.0000 [drp] | OPHTHALMIC | 0 refills | Status: DC

## 2021-04-17 NOTE — Progress Notes (Signed)

## 2021-04-25 LAB — BASIC METABOLIC PANEL
BUN/Creatinine Ratio: 9 (ref 9–20)
BUN: 7 mg/dL (ref 6–20)
CO2: 24 mmol/L (ref 20–29)
Calcium: 9.7 mg/dL (ref 8.7–10.2)
Chloride: 98 mmol/L (ref 96–106)
Creatinine, Ser: 0.74 mg/dL — ABNORMAL LOW (ref 0.76–1.27)
Glucose: 121 mg/dL — ABNORMAL HIGH (ref 70–99)
Potassium: 4.3 mmol/L (ref 3.5–5.2)
Sodium: 138 mmol/L (ref 134–144)
eGFR: 121 mL/min/{1.73_m2} (ref >59)

## 2021-04-25 LAB — HIV ANTIBODY (ROUTINE TESTING W REFLEX): HIV Screen 4th Generation wRfx: NONREACTIVE

## 2021-04-25 LAB — CBC WITH DIFFERENTIAL/PLATELET
Basophils Absolute: 0.1 10*3/uL (ref 0.0–0.2)
Basos: 1 %
EOS (ABSOLUTE): 0.1 10*3/uL (ref 0.0–0.4)
Eos: 1 %
Hematocrit: 46.5 % (ref 37.5–51.0)
Hemoglobin: 16.8 g/dL (ref 13.0–17.7)
Immature Grans (Abs): 0.1 10*3/uL (ref 0.0–0.1)
Immature Granulocytes: 1 %
Lymphocytes Absolute: 2.6 10*3/uL (ref 0.7–3.1)
Lymphs: 21 %
MCH: 31.5 pg (ref 26.6–33.0)
MCHC: 36.1 g/dL — ABNORMAL HIGH (ref 31.5–35.7)
MCV: 87 fL (ref 79–97)
Monocytes Absolute: 1.2 10*3/uL — ABNORMAL HIGH (ref 0.1–0.9)
Monocytes: 9 %
Neutrophils Absolute: 8.5 10*3/uL — ABNORMAL HIGH (ref 1.4–7.0)
Neutrophils: 67 %
Platelets: 364 10*3/uL (ref 150–450)
RBC: 5.34 x10E6/uL (ref 4.14–5.80)
RDW: 12 % (ref 11.6–15.4)
WBC: 12.5 10*3/uL — ABNORMAL HIGH (ref 3.4–10.8)

## 2021-04-25 LAB — LIPID PANEL
Chol/HDL Ratio: 3.4 ratio (ref 0.0–5.0)
Cholesterol, Total: 209 mg/dL — ABNORMAL HIGH (ref 100–199)
HDL: 62 mg/dL (ref >39)
LDL Chol Calc (NIH): 119 mg/dL — ABNORMAL HIGH (ref 0–99)
Triglycerides: 160 mg/dL — ABNORMAL HIGH (ref 0–149)
VLDL Cholesterol Cal: 28 mg/dL (ref 5–40)

## 2021-04-25 LAB — HEPATIC FUNCTION PANEL
ALT: 85 IU/L — ABNORMAL HIGH (ref 0–44)
AST: 54 IU/L — ABNORMAL HIGH (ref 0–40)
Albumin: 4.9 g/dL (ref 4.0–5.0)
Alkaline Phosphatase: 110 IU/L (ref 44–121)
Bilirubin Total: 0.3 mg/dL (ref 0.0–1.2)
Bilirubin, Direct: 0.1 mg/dL (ref 0.00–0.40)
Total Protein: 7.6 g/dL (ref 6.0–8.5)

## 2021-04-25 LAB — HEPATITIS C ANTIBODY: Hep C Virus Ab: NONREACTIVE

## 2021-05-01 ENCOUNTER — Ambulatory Visit (INDEPENDENT_AMBULATORY_CARE_PROVIDER_SITE_OTHER): Payer: Managed Care, Other (non HMO) | Admitting: Family Medicine

## 2021-05-01 ENCOUNTER — Other Ambulatory Visit: Payer: Self-pay

## 2021-05-01 VITALS — BP 124/84 | HR 97 | Temp 97.4°F | Ht 68.0 in | Wt 171.0 lb

## 2021-05-01 DIAGNOSIS — Z0000 Encounter for general adult medical examination without abnormal findings: Secondary | ICD-10-CM

## 2021-05-01 DIAGNOSIS — G4730 Sleep apnea, unspecified: Secondary | ICD-10-CM

## 2021-05-01 DIAGNOSIS — R748 Abnormal levels of other serum enzymes: Secondary | ICD-10-CM | POA: Diagnosis not present

## 2021-05-01 DIAGNOSIS — M7661 Achilles tendinitis, right leg: Secondary | ICD-10-CM

## 2021-05-01 DIAGNOSIS — R739 Hyperglycemia, unspecified: Secondary | ICD-10-CM

## 2021-05-01 DIAGNOSIS — Z0001 Encounter for general adult medical examination with abnormal findings: Secondary | ICD-10-CM | POA: Diagnosis not present

## 2021-05-01 DIAGNOSIS — E785 Hyperlipidemia, unspecified: Secondary | ICD-10-CM

## 2021-05-01 DIAGNOSIS — D72829 Elevated white blood cell count, unspecified: Secondary | ICD-10-CM | POA: Diagnosis not present

## 2021-05-01 DIAGNOSIS — Z720 Tobacco use: Secondary | ICD-10-CM

## 2021-05-01 NOTE — Progress Notes (Signed)
° °  Subjective:    Patient ID: Cody [name], male    DOB: 1986-01-25, 36 y.o.   MRN: [id number]  HPI The patient comes in today for a wellness visit.    A review of their health history was completed.  A review of medications was also completed.  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   Additional concerns: UC last week was achiles tendonitis  Health habits-he does use chewing tobacco on a daily basis Does drink 1 or 2 beers per day and on the weekends a few more denies abuse Patient denies depression Tries to be safe and what he does Review of Systems     Objective:   Physical Exam  General-in no acute distress Eyes-no discharge Lungs-respiratory rate normal, CTA CV-no murmurs,RRR Extremities skin warm dry no edema Neuro grossly normal Behavior normal, alert The gums have changes consistent with tobacco use no cancer was seen but warning signs discussed patient was encouraged to quit to lessen risk of cancer neck no masses was felt abdomen soft no masses      Assessment & Plan:  1. Well adult exam Adult wellness-complete.wellness physical was conducted today. Importance of diet and exercise were discussed in detail.  In addition to this a discussion regarding safety was also covered. We also reviewed over immunizations and gave recommendations regarding current immunization needed for age.  In addition to this additional areas were also touched on including: Preventative health exams needed:  Colonoscopy not indicated  Patient was advised yearly wellness exam  - CBC with Differential/Platelet - Hepatic function panel - Glucose - Hemoglobin A1c - Lipid panel  2. Elevated liver enzymes Could be due to fatty liver could be due to alcohol cut back on alcohol  encouraged him if possible to quit altogether.  Repeat liver enzymes before follow-up visit in 3 months - CBC with Differential/Platelet - Hepatic function panel - Glucose - Hemoglobin A1c - Lipid panel  3. Hyperglycemia Minimize starches sugars check glucose and A1c before follow-up in 3 months exercise regular basis try to bring weight down some - CBC with Differential/Platelet - Hepatic function panel - Glucose - Hemoglobin A1c - Lipid panel  4. Leukocytosis, unspecified type Repeat CBC before follow-up visit unlikely that this is a sign of cancer but will follow-up CBC in 3 months - CBC with Differential/Platelet - Hepatic function panel - Glucose - Hemoglobin A1c - Lipid panel  5. Sleep apnea, unspecified type Patient prefers home sleep study - Ambulatory referral to Sleep Studies  6. Achilles tendinitis of right lower extremity Continue anti-inflammatories for no more than 3 to 4 weeks stretches were shown ice compression after use  7. Hyperlipidemia, unspecified hyperlipidemia type Healthy diet regular activity statin not indicated currently repeat before follow-up visit in 3 months - CBC with Differential/Platelet - Hepatic function panel - Glucose - Hemoglobin A1c - Lipid panel  8. Tobacco use There is gum changes due to excessive tobacco use patient was encouraged to quit this altogether We did discuss utilizing patches if that does not help consideration for medication

## 2021-05-13 ENCOUNTER — Other Ambulatory Visit: Payer: Self-pay | Admitting: *Deleted

## 2021-05-13 DIAGNOSIS — G4730 Sleep apnea, unspecified: Secondary | ICD-10-CM

## 2021-07-10 ENCOUNTER — Encounter: Payer: Self-pay | Admitting: Family Medicine

## 2021-07-10 ENCOUNTER — Ambulatory Visit (INDEPENDENT_AMBULATORY_CARE_PROVIDER_SITE_OTHER): Payer: Managed Care, Other (non HMO) | Admitting: Family Medicine

## 2021-07-10 VITALS — BP 134/76 | HR 103 | Temp 98.2°F | Wt 174.6 lb

## 2021-07-10 DIAGNOSIS — J301 Allergic rhinitis due to pollen: Secondary | ICD-10-CM | POA: Diagnosis not present

## 2021-07-10 MED ORDER — AZELASTINE HCL 0.1 % NA SOLN: 2.0000 | Freq: Two times a day (BID) | NASAL | 12 refills | Status: DC

## 2021-07-10 MED ORDER — CETIRIZINE-PSEUDOEPHEDRINE ER 5-120 MG PO TB12: 1.0000 | ORAL_TABLET | Freq: Two times a day (BID) | ORAL | 0 refills | Status: DC

## 2021-07-10 MED ORDER — ALBUTEROL SULFATE HFA 108 (90 BASE) MCG/ACT IN AERS: 2.0000 | INHALATION_SPRAY | Freq: Four times a day (QID) | RESPIRATORY_TRACT | 0 refills | Status: DC | PRN

## 2021-07-10 NOTE — Assessment & Plan Note (Signed)
No evidence of infection at this time.  Given the fact that this started after mowing grass, I suspect this is all due to allergic rhinitis.  He is having some associated wheezing.  Treating with Zyrtec-D, azelastine, and albuterol. ?

## 2021-07-10 NOTE — Patient Instructions (Signed)
This is likely allergic in nature due to the dust and debris from mowing. ? ?Medications as prescribed.  If your symptoms worsen please let us know ? ?Dr. Adriana Simas ?

## 2021-07-10 NOTE — Progress Notes (Signed)
? ?Subjective:  ?Patient ID: Cody [name], male    DOB: 1986/01/26  Age: 36 y.o. MRN: [id number] ? ?CC: ?Chief Complaint  ?Patient presents with  ? URI  ?  Drainage, wheezing after cutting grass yesterday  ? ? ?HPI: ? ?36 year old male presents for evaluation of the above. ? ?Patient reports that his symptoms started yesterday after he mowed grass.  He reports postnasal drip, runny nose and some associated cough.  No fever.  No reported sick contacts.  No relieving factors.  No other associated symptoms.  No other complaints or concerns at this time. ? ?Social Hx   ?Social History  ? ?Socioeconomic History  ? Marital status: Married  ?  Spouse name: Not on file  ? Number of children: Not on file  ? Years of education: Not on file  ? Highest education level: Not on file  ?Occupational History  ? Not on file  ?Tobacco Use  ? Smoking status: Never  ? Smokeless tobacco: Current  ?Substance and Sexual Activity  ? Alcohol use: Yes  ?  Comment: occasionally  ? Drug use: No  ? Sexual activity: Not on file  ?Other Topics Concern  ? Not on file  ?Social History Narrative  ? Not on file  ? ?Social Determinants of Health  ? ?Financial Resource Strain: Not on file  ?Food Insecurity: Not on file  ?Transportation Needs: Not on file  ?Physical Activity: Not on file  ?Stress: Not on file  ?Social Connections: Not on file  ? ? ?Review of Systems ?Per HPI ? ?Objective:  ?BP 134/76   Pulse (!) 103   Temp 98.2 ?F (36.8 ?C)   Wt 174 lb 9.6 oz (79.2 kg)   SpO2 98%   BMI 26.55 kg/m?  ? ? ?  07/10/2021  ? 11:08 AM 05/01/2021  ? 11:05 AM 05/01/2021  ? 10:18 AM  ?BP/Weight  ?Systolic BP 134 124 120  ?Diastolic BP 76 84 88  ?Wt. (Lbs) 174.6  171  ?BMI 26.55 kg/m2  26 kg/m2  ? ? ?Physical Exam ?Vitals and nursing note reviewed.  ?Constitutional:   ?   General: He is not in acute distress. ?   Appearance: Normal appearance. He is not ill-appearing.  ?HENT:  ?   Head: Normocephalic and atraumatic.  ?   Nose: Congestion present.  ?    Mouth/Throat:  ?   Pharynx: Posterior oropharyngeal erythema present.  ?Cardiovascular:  ?   Rate and Rhythm: Normal rate and regular rhythm.  ?   Heart sounds: No murmur heard. ?Pulmonary:  ?   Effort: Pulmonary effort is normal.  ?   Breath sounds: Wheezing present.  ?Neurological:  ?   Mental Status: He is alert.  ? ? ?Lab Results  ?Component Value Date  ? WBC 12.5 (H) 04/24/2021  ? HGB 16.8 04/24/2021  ? HCT 46.5 04/24/2021  ? PLT 364 04/24/2021  ? GLUCOSE 121 (H) 04/24/2021  ? CHOL 209 (H) 04/24/2021  ? TRIG 160 (H) 04/24/2021  ? HDL 62 04/24/2021  ? LDLCALC 119 (H) 04/24/2021  ? ALT 85 (H) 04/24/2021  ? AST 54 (H) 04/24/2021  ? NA 138 04/24/2021  ? K 4.3 04/24/2021  ? CL 98 04/24/2021  ? CREATININE 0.74 (L) 04/24/2021  ? BUN 7 04/24/2021  ? CO2 24 04/24/2021  ? INR 1.00 05/18/2011  ? ? ? ?Assessment & Plan:  ? ?Problem List Items Addressed This Visit   ? ?  ? Respiratory  ? Allergic rhinitis  due to pollen - Primary  ?  No evidence of infection at this time.  Given the fact that this started after mowing grass, I suspect this is all due to allergic rhinitis.  He is having some associated wheezing.  Treating with Zyrtec-D, azelastine, and albuterol. ? ?  ?  ? ? ?Meds ordered this encounter  ?Medications  ? albuterol (VENTOLIN HFA) 108 (90 Base) MCG/ACT inhaler  ?  Sig: Inhale 2 puffs into the lungs every 6 (six) hours as needed for wheezing or shortness of breath.  ?  Dispense:  18 g  ?  Refill:  0  ? cetirizine-pseudoephedrine (ZYRTEC-D) 5-120 MG tablet  ?  Sig: Take 1 tablet by mouth 2 (two) times daily.  ?  Dispense:  30 tablet  ?  Refill:  0  ? azelastine (ASTELIN) 0.1 % nasal spray  ?  Sig: Place 2 sprays into both nostrils 2 (two) times daily. Use in each nostril as directed  ?  Dispense:  30 mL  ?  Refill:  12  ? ?Everlene Other DO ?[facility] ? ?

## 2021-07-31 ENCOUNTER — Ambulatory Visit: Payer: Managed Care, Other (non HMO) | Admitting: Family Medicine

## 2021-07-31 ENCOUNTER — Encounter: Payer: Self-pay | Admitting: Family Medicine

## 2021-07-31 VITALS — BP 136/86 | HR 74 | Temp 98.3°F | Wt 174.4 lb

## 2021-07-31 DIAGNOSIS — E785 Hyperlipidemia, unspecified: Secondary | ICD-10-CM | POA: Diagnosis not present

## 2021-07-31 DIAGNOSIS — R748 Abnormal levels of other serum enzymes: Secondary | ICD-10-CM

## 2021-07-31 DIAGNOSIS — Z1159 Encounter for screening for other viral diseases: Secondary | ICD-10-CM | POA: Diagnosis not present

## 2021-07-31 DIAGNOSIS — G4730 Sleep apnea, unspecified: Secondary | ICD-10-CM

## 2021-07-31 LAB — HEPATIC FUNCTION PANEL
ALT: 63 IU/L — ABNORMAL HIGH (ref 0–44)
AST: 44 IU/L — ABNORMAL HIGH (ref 0–40)
Albumin: 4.9 g/dL (ref 4.0–5.0)
Alkaline Phosphatase: 91 IU/L (ref 44–121)
Bilirubin Total: 0.4 mg/dL (ref 0.0–1.2)
Bilirubin, Direct: 0.11 mg/dL (ref 0.00–0.40)
Total Protein: 7.4 g/dL (ref 6.0–8.5)

## 2021-07-31 LAB — CBC WITH DIFFERENTIAL/PLATELET
Basophils Absolute: 0.1 10*3/uL (ref 0.0–0.2)
Basos: 1 %
EOS (ABSOLUTE): 0.3 10*3/uL (ref 0.0–0.4)
Eos: 3 %
Hematocrit: 46.7 % (ref 37.5–51.0)
Hemoglobin: 16.3 g/dL (ref 13.0–17.7)
Immature Grans (Abs): 0 10*3/uL (ref 0.0–0.1)
Immature Granulocytes: 1 %
Lymphocytes Absolute: 2.4 10*3/uL (ref 0.7–3.1)
Lymphs: 28 %
MCH: 30.4 pg (ref 26.6–33.0)
MCHC: 34.9 g/dL (ref 31.5–35.7)
MCV: 87 fL (ref 79–97)
Monocytes Absolute: 1 10*3/uL — ABNORMAL HIGH (ref 0.1–0.9)
Monocytes: 12 %
Neutrophils Absolute: 4.9 10*3/uL (ref 1.4–7.0)
Neutrophils: 55 %
Platelets: 348 10*3/uL (ref 150–450)
RBC: 5.37 x10E6/uL (ref 4.14–5.80)
RDW: 12.4 % (ref 11.6–15.4)
WBC: 8.6 10*3/uL (ref 3.4–10.8)

## 2021-07-31 LAB — LIPID PANEL
Chol/HDL Ratio: 3.5 ratio (ref 0.0–5.0)
Cholesterol, Total: 209 mg/dL — ABNORMAL HIGH (ref 100–199)
HDL: 59 mg/dL (ref >39)
LDL Chol Calc (NIH): 121 mg/dL — ABNORMAL HIGH (ref 0–99)
Triglycerides: 165 mg/dL — ABNORMAL HIGH (ref 0–149)
VLDL Cholesterol Cal: 29 mg/dL (ref 5–40)

## 2021-07-31 LAB — GLUCOSE, RANDOM: Glucose: 115 mg/dL — ABNORMAL HIGH (ref 70–99)

## 2021-07-31 LAB — HEMOGLOBIN A1C
Est. average glucose Bld gHb Est-mCnc: 108 mg/dL
Hgb A1c MFr Bld: 5.4 % (ref 4.8–5.6)

## 2021-07-31 NOTE — Progress Notes (Signed)
Subjective:    Patient ID: Cody [name], male    DOB: March 05, 1985, 36 y.o.   MRN: [id number]  HPI Pt here for follow up from March visit. Pt had lab work completed yesterday.   Pt also inquiring about home sleep study.  1. Elevated liver enzymes Very important for the patient to work hard at getting away from alcohol he is doing a good job so far but on the weekends he needs to cut it down to no more than 2 beers and if possible try to quit totally during the week try to stay away from it altogether  More than likely fatty liver disease Could have mild alcohol component to this The importance to get this under control to prevent cirrhosis discussed in detail - US Abdomen Limited RUQ (LIVER/GB) - Alpha-1-antitrypsin - Fe+TIBC+Fer - Mitochondrial antibodies - Ceruloplasmin - Anti-Smooth Muscle Antibody, IGG - Hepatitis B Surface AntiGEN - Hepatitis C Antibody  2. Sleep apnea, unspecified type He never heard regarding the sleep apnea the nurse did connect with Toni Amend and they are going to process this again - Alpha-1-antitrypsin - Fe+TIBC+Fer - Mitochondrial antibodies - Ceruloplasmin - Anti-Smooth Muscle Antibody, IGG - Hepatitis B Surface AntiGEN - Hepatitis C Antibody  3. Hyperlipidemia, unspecified hyperlipidemia type Mild cholesterol elevation healthy diet recommended does not need statin currently - Alpha-1-antitrypsin - Fe+TIBC+Fer - Mitochondrial antibodies - Ceruloplasmin - Anti-Smooth Muscle Antibody, IGG - Hepatitis B Surface AntiGEN - Hepatitis C Antibody  4. Encounter for hepatitis C screening test for low risk patient Screening labs regarding elevated liver enzymes - Alpha-1-antitrypsin - Fe+TIBC+Fer - Mitochondrial antibodies - Ceruloplasmin - Anti-Smooth Muscle Antibody, IGG - Hepatitis B Surface AntiGEN - Hepatitis C Antibody  5. Need for hepatitis B screening test Screening labs regarding elevated liver enzymes - Alpha-1-antitrypsin -  Fe+TIBC+Fer - Mitochondrial antibodies - Ceruloplasmin - Anti-Smooth Muscle Antibody, IGG - Hepatitis B Surface AntiGEN - Hepatitis C Antibody Follow-up by 3 months from now follow-up sooner depending on the results of all these tests  Review of Systems  Constitutional:  Negative for activity change, fatigue and fever.  HENT:  Negative for congestion and rhinorrhea.   Respiratory:  Negative for cough and shortness of breath.   Cardiovascular:  Negative for chest pain and leg swelling.  Gastrointestinal:  Negative for abdominal pain, diarrhea and nausea.  Genitourinary:  Negative for dysuria and hematuria.  Neurological:  Negative for weakness and headaches.  Psychiatric/Behavioral:  Negative for agitation and behavioral problems.       Objective:   Physical Exam Vitals reviewed.  Constitutional:      General: He is not in acute distress. HENT:     Head: Normocephalic and atraumatic.  Eyes:     General:        Right eye: No discharge.        Left eye: No discharge.  Neck:     Trachea: No tracheal deviation.  Cardiovascular:     Rate and Rhythm: Normal rate and regular rhythm.     Heart sounds: Normal heart sounds. No murmur heard. Pulmonary:     Effort: Pulmonary effort is normal. No respiratory distress.     Breath sounds: Normal breath sounds. No wheezing.  Lymphadenopathy:     Cervical: No cervical adenopathy.  Skin:    General: Skin is warm.     Findings: No rash.  Neurological:     Mental Status: He is alert.  Psychiatric:        Behavior: Behavior normal.  Assessment & Plan:  See discussion held above

## 2021-08-15 LAB — CERULOPLASMIN: Ceruloplasmin: 21.6 mg/dL (ref 16.0–31.0)

## 2021-08-15 LAB — IRON,TIBC AND FERRITIN PANEL
Iron Saturation: 23 % (ref 15–55)
Iron: 89 ug/dL (ref 38–169)
Total Iron Binding Capacity: 384 ug/dL (ref 250–450)
UIBC: 295 ug/dL (ref 111–343)

## 2021-08-15 LAB — HEPATITIS C ANTIBODY: Hep C Virus Ab: NONREACTIVE

## 2021-08-15 LAB — HEPATITIS B SURFACE ANTIGEN: Hepatitis B Surface Ag: NEGATIVE

## 2021-08-16 LAB — ANTI-SMOOTH MUSCLE ANTIBODY, IGG: Smooth Muscle Ab: 4 Units (ref 0–19)

## 2021-08-16 LAB — IRON,TIBC AND FERRITIN PANEL: Ferritin: 371 ng/mL (ref 30–400)

## 2021-08-16 LAB — ALPHA-1-ANTITRYPSIN: A-1 Antitrypsin: 147 mg/dL (ref 95–164)

## 2021-08-16 LAB — MITOCHONDRIAL ANTIBODIES: Mitochondrial Ab: <20 Units (ref 0.0–20.0)

## 2021-08-17 ENCOUNTER — Ambulatory Visit: Payer: Managed Care, Other (non HMO) | Admitting: Neurology

## 2021-08-17 ENCOUNTER — Encounter: Payer: Self-pay | Admitting: Neurology

## 2021-08-17 VITALS — BP 116/74 | HR 67 | Ht 68.0 in | Wt 178.8 lb

## 2021-08-17 DIAGNOSIS — R0681 Apnea, not elsewhere classified: Secondary | ICD-10-CM

## 2021-08-17 DIAGNOSIS — R0683 Snoring: Secondary | ICD-10-CM

## 2021-08-17 DIAGNOSIS — E663 Overweight: Secondary | ICD-10-CM | POA: Diagnosis not present

## 2021-08-17 DIAGNOSIS — G4719 Other hypersomnia: Secondary | ICD-10-CM | POA: Diagnosis not present

## 2021-08-17 NOTE — Patient Instructions (Signed)

## 2021-08-17 NOTE — Progress Notes (Signed)
Subjective:    [name] ID: Cody [name] is a 36 y.o. male.  HPI    Cody Foley, MD, PhD [facility] [address] [address] [place], [place] [address]  Dear Dr. Gerda Diss,   I saw your [name], Cody [name], upon your kind request in my sleep clinic today for initial consultation of his sleep disorder, in particular, concern for underlying obstructive sleep apnea.  Cody [name] is accompanied by his wife today.  As you know, Cody [name] is a 36 year old right-handed gentleman with an underlying medical history of hyperlipidemia, elevated liver enzymes, tobacco use, and mildly overweight state, who reports snoring and excessive daytime somnolence as well as witnessed apneas (per wife's observation) and nonrestorative sleep.  I reviewed your office note from 05/01/2021.  His Epworth sleepiness score is 12 out of 24, fatigue severity score is 40 out of 63. He works second shift and has done so for Cody past 16 years.  He has to work from 3 PM to 12:30 AM.  He works as a Advice worker.  He has a bedtime generally around 2:30 AM and rise time generally between 10 and 11 AM, sometimes as late as 1 PM.  He does not have night to night nocturia or recurrent morning headaches, he is not aware of any family history of sleep apnea.  He drinks caffeine in Cody form of soda, 3 to 4 cans/day, occasional coffee, he does not smoke but dips tobacco.  He drinks alcohol occasionally, not daily.  He lives with his wife and 2 children, ages 52 and nearly 2.  They have 2 dogs in Cody household, Cody dogs do not sleep in Cody bedroom, they also have 2 outside dogs.  They have a TV in Cody bedroom but it is not typically on at night.  Weight has been stable for Cody most part, within about 10 pounds.   His Past Medical History Is Significant For: Past Medical History:  Diagnosis Date   No pertinent past medical history     His Past Surgical History Is Significant For: Past  Surgical History:  Procedure Laterality Date   LUMBAR LAMINECTOMY/DECOMPRESSION MICRODISCECTOMY  05/18/2011   Procedure: LUMBAR LAMINECTOMY/DECOMPRESSION MICRODISCECTOMY;  Surgeon: Clydene Fake, MD;  Location: [facility];  Service: Neurosurgery;  Laterality: Left;  Left Lumbar Five-Sacral One Laminectomy/Diskectomy    His Family History Is Significant For: Family History  Problem Relation Age of Onset   Diabetes Mother    Diabetes Maternal Grandmother    Breast cancer Maternal Grandmother     His Social History Is Significant For: Social History   Socioeconomic History   Marital status: Married    Spouse name: Not on file   Number of children: Not on file   Years of education: Not on file   Highest education level: Not on file  Occupational History   Not on file  Tobacco Use   Smoking status: Never   Smokeless tobacco: Current    Types: Chew  Vaping Use   Vaping Use: Never used  Substance and Sexual Activity   Alcohol use: Yes    Comment: occasionally   Drug use: No   Sexual activity: Not on file  Other Topics Concern   Not on file  Social History Narrative   Caffeine 3-4 daily soda.     Education" some Chemical engineer, Trucking    Social Determinants of Health   Financial Resource Strain: Not on [employer]  Insecurity: Not on file  Transportation Needs: Not on file  Physical Activity: Not on file  Stress: Not on file  Social Connections: Not on file    His Allergies Are:  Allergies  Allergen Reactions   Bee Venom   :   His Current Medications Are:  Outpatient Encounter Medications as of 08/17/2021  Medication Sig   albuterol (VENTOLIN HFA) 108 (90 Base) MCG/ACT inhaler Inhale 2 puffs into Cody lungs every 6 (six) hours as needed for wheezing or shortness of breath. ([name] not taking: Reported on 08/17/2021)   azelastine (ASTELIN) 0.1 % nasal spray Place 2 sprays into both nostrils 2 (two) times daily. Use in each nostril as directed ([name] not  taking: Reported on 08/17/2021)   cetirizine-pseudoephedrine (ZYRTEC-D) 5-120 MG tablet Take 1 tablet by mouth 2 (two) times daily. ([name] not taking: Reported on 08/17/2021)   No facility-administered encounter medications on file as of 08/17/2021.  :   Review of Systems:  Out of a complete 14 point review of systems, all are reviewed and negative with Cody exception of these symptoms as listed below:  Review of Systems  Neurological:        Tiredness all Cody time, witness apnea, snoring.  ESS 12, FSS 40.     Objective:  Neurological Exam  Physical Exam Physical Examination:   Vitals:   08/17/21 1117  BP: 116/74  Pulse: 67    General Examination: Cody [name] is a very pleasant 36 y.o. male in no acute distress. He appears well-developed and well-nourished and very well groomed.   HEENT: Normocephalic, atraumatic, pupils are equal, round and reactive to light, extraocular tracking is good without limitation to gaze excursion or nystagmus noted. Hearing is grossly intact. Face is symmetric with normal facial animation. Speech is clear with no dysarthria noted. There is no hypophonia. There is no lip, neck/head, jaw or voice tremor. Neck is supple with full range of passive and active motion. There are no carotid bruits on auscultation. Oropharynx exam reveals: mild mouth dryness, good dental hygiene and mild airway crowding, due to tonsillar size of about 1+, slightly prominent uvula, Mallampati class I, small airway entry.  Nasal inspection reveals narrow nasal passage on Cody right, mild inferior turbinate hypertrophy on Cody left, no significantly deviated septum.  Neck circumference of 17 inches, minimal overbite.  Tongue protrudes centrally and palate elevates symmetrically.  Chest: Clear to auscultation without wheezing, rhonchi or crackles noted.  Heart: S1+S2+0, regular and normal without murmurs, rubs or gallops noted.   Abdomen: Soft, non-tender and  non-distended.  Extremities: There is no pitting edema in Cody distal lower extremities bilaterally.   Skin: Warm and dry without trophic changes noted.   Musculoskeletal: exam reveals no obvious joint deformities.   Neurologically:  Mental status: Cody [name] is awake, alert and oriented in all 4 spheres. His immediate and remote memory, attention, language skills and fund of knowledge are appropriate. There is no evidence of aphasia, agnosia, apraxia or anomia. Speech is clear with normal prosody and enunciation. Thought process is linear. Mood is normal and affect is normal.  Cranial nerves II - XII are as described above under HEENT exam.  Motor exam: Normal bulk, strength and tone is noted. There is no trobvious tremor. Fine motor skills and coordination: grossly intact.  Cerebellar testing: No dysmetria or intention tremor. There is no truncal or gait ataxia.  Sensory exam: intact to light touch in Cody upper and lower extremities.  Gait, station and balance: He  stands easily. No veering to one side is noted. No leaning to one side is noted. Posture is age-appropriate and stance is narrow based. Gait shows normal stride length and normal pace. No problems turning are noted.   Assessment and Plan:  In summary, Cody [name] is a very pleasant 36 y.o.-year old male with an underlying medical history of hyperlipidemia, elevated liver enzymes, tobacco use, and mildly overweight state, whose history and physical exam are concerning for obstructive sleep apnea (OSA). I had a long chat with Cody [name] and his wife about my findings and Cody diagnosis of OSA, its prognosis and treatment options. We talked about medical treatments, surgical interventions and non-pharmacological approaches. I explained in particular Cody risks and ramifications of untreated moderate to severe OSA, especially with respect to developing cardiovascular disease down Cody Road, including congestive heart failure, difficult to  treat hypertension, cardiac arrhythmias, or stroke. Even type 2 diabetes has, in part, been linked to untreated OSA. Symptoms of untreated OSA include daytime sleepiness, memory problems, mood irritability and mood disorder such as depression and anxiety, lack of energy, as well as recurrent headaches, especially morning headaches. We talked about tobacco cessation and trying to maintain a healthy lifestyle in general, as well as Cody importance of weight control. We also talked about Cody importance of good sleep hygiene. I recommended Cody following at this time: sleep study.  I outlined Cody differences between a laboratory attended sleep study versus home sleep testing. I explained Cody sleep test procedure to Cody [name] and also outlined possible surgical and non-surgical treatment options of OSA, including Cody use of a custom-made dental device (which would require a referral to a specialist dentist or oral surgeon), upper airway surgical options, such as traditional UPPP or a novel less invasive surgical option in Cody form of Inspire hypoglossal nerve stimulation (which would involve a referral to an ENT surgeon). I also explained Cody CPAP treatment option to Cody [name], who indicated that he would be willing to try PAP therapy, if Cody need arises. I explained Cody importance of being compliant with PAP treatment, not only for insurance purposes but primarily to improve His symptoms, and for Cody [name]'s long term health benefit, including to reduce His cardiovascular risks. I answered all their questions today and Cody [name] and his wife were in agreement. I plan to see him back after Cody sleep study is completed and encouraged him to call with any interim questions, concerns, problems or updates.   Thank you very much for allowing me to participate in Cody care of this nice [name]. If I can be of any further assistance to you please do not hesitate to call me at [phone].  Sincerely,   Cody Foley, MD, PhD

## 2021-08-18 NOTE — Telephone Encounter (Signed)
Friday, 08/21/2021 at 10:30am, Arrival time is 10:15am.

## 2021-08-20 ENCOUNTER — Telehealth: Payer: Self-pay | Admitting: Neurology

## 2021-08-20 NOTE — Telephone Encounter (Signed)
HST- [employer] no auth req spoke to Sharon ref # [id number]. Patient is scheduled at [facility] for 09/15/21 at 10:30 AM.

## 2021-08-21 ENCOUNTER — Ambulatory Visit (HOSPITAL_COMMUNITY)
Admission: RE | Admit: 2021-08-21 | Discharge: 2021-08-21 | Disposition: A | Discharge status: Home / Self Care | Payer: Managed Care, Other (non HMO) | Source: Ambulatory Visit | Attending: Family Medicine | Admitting: Family Medicine

## 2021-08-21 DIAGNOSIS — R748 Abnormal levels of other serum enzymes: Secondary | ICD-10-CM | POA: Diagnosis present

## 2021-08-27 ENCOUNTER — Other Ambulatory Visit: Payer: Self-pay

## 2021-08-27 DIAGNOSIS — K760 Fatty (change of) liver, not elsewhere classified: Secondary | ICD-10-CM

## 2021-08-27 DIAGNOSIS — R748 Abnormal levels of other serum enzymes: Secondary | ICD-10-CM

## 2021-08-27 DIAGNOSIS — E785 Hyperlipidemia, unspecified: Secondary | ICD-10-CM

## 2021-08-27 DIAGNOSIS — D72829 Elevated white blood cell count, unspecified: Secondary | ICD-10-CM

## 2021-08-27 DIAGNOSIS — R7301 Impaired fasting glucose: Secondary | ICD-10-CM

## 2021-08-27 DIAGNOSIS — Z130 Encounter for screening for diseases of the blood and blood-forming organs and certain disorders involving the immune mechanism: Secondary | ICD-10-CM

## 2021-09-15 ENCOUNTER — Ambulatory Visit: Payer: Managed Care, Other (non HMO) | Admitting: Neurology

## 2021-09-15 DIAGNOSIS — G4733 Obstructive sleep apnea (adult) (pediatric): Secondary | ICD-10-CM

## 2021-09-15 DIAGNOSIS — R0683 Snoring: Secondary | ICD-10-CM

## 2021-09-15 DIAGNOSIS — R0681 Apnea, not elsewhere classified: Secondary | ICD-10-CM

## 2021-09-15 DIAGNOSIS — G4719 Other hypersomnia: Secondary | ICD-10-CM

## 2021-09-15 DIAGNOSIS — E663 Overweight: Secondary | ICD-10-CM

## 2021-09-15 DIAGNOSIS — G4734 Idiopathic sleep related nonobstructive alveolar hypoventilation: Secondary | ICD-10-CM

## 2021-09-17 NOTE — Progress Notes (Signed)
See procedure note.

## 2021-09-17 NOTE — Procedures (Signed)
     [facility]  HOME SLEEP TEST (Watch PAT) REPORT  STUDY DATE: 09/16/2021  DOB: 14-Dec-1985  MRN: [id number]  ORDERING CLINICIAN: Huston Foley, MD, PhD   REFERRING CLINICIAN: Babs Sciara, MD   CLINICAL INFORMATION/HISTORY: 36 year old right-handed gentleman with an underlying medical history of hyperlipidemia, elevated liver enzymes, tobacco use, and mildly overweight state, who reports snoring and excessive daytime somnolence as well as witnessed apneas.  Epworth sleepiness score: 12/24.  BMI: 27.1 kg/m  FINDINGS:   Sleep Summary:   Total Recording Time (hours, min): 7 hours, 51 min  Total Sleep Time (hours, min):  7 hours, 10 min  Percent REM (%):    21%   Respiratory Indices:   Calculated pAHI (per hour):  49.8/hour         REM pAHI:    63.7/hour       NREM pAHI: 46.2/hour  Central pAHI: 2.3/hour  Oxygen Saturation Statistics:    Oxygen Saturation (%) Mean: 91%   Minimum oxygen saturation (%):                 71%   O2 Saturation Range (%): 71-99%    O2 Saturation (minutes) <=88%: 43.8 min  Pulse Rate Statistics:   Pulse Mean (bpm):    69/min    Pulse Range (46-108/min)   IMPRESSION: OSA (obstructive sleep apnea), severe Nocturnal hypoxemia  RECOMMENDATION:  This home sleep test demonstrates severe obstructive sleep apnea with a total AHI of 49.8/hour and O2 nadir of 71% with significant time below or at 88% saturation of 43.8 minutes for the night, indicating nocturnal hypoxemia.  Snoring was consistently in the moderate range. Treatment with positive airway pressure is highly recommended. This will require - ideally - a full night CPAP titration study for proper treatment settings, O2 monitoring and mask fitting. For now, the patient will be advised to proceed with an autoPAP titration/trial at home.  A laboratory attended titration study can be considered in the future for optimization of his treatment and better tolerance of  therapy.  Alternative treatment options are limited secondary to the severity of the patient's sleep disordered breathing, but may include Inspire, hypoglossal nerve stimulator in selected patients.  Please note, that untreated obstructive sleep apnea may carry additional perioperative morbidity. Patients with significant obstructive sleep apnea should receive perioperative PAP therapy and the surgeons and particularly the anesthesiologist should be informed of the diagnosis and the severity of the sleep disordered breathing. The patient should be cautioned not to drive, work at heights, or operate dangerous or heavy equipment when tired or sleepy. Review and reiteration of good sleep hygiene measures should be pursued with any patient. Other causes of the patient's symptoms, including circadian rhythm disturbances, an underlying mood disorder, medication effect and/or an underlying medical problem cannot be ruled out based on this test. Clinical correlation is recommended. The patient and his referring provider will be notified of the test results. The patient will be seen in follow up in sleep clinic at [facility].  I certify that I have reviewed the raw data recording prior to the issuance of this report in accordance with the standards of the American Academy of Sleep Medicine (AASM).  INTERPRETING PHYSICIAN:   Huston Foley, MD, PhD  Board Certified in Neurology and Sleep Medicine  [facility] [address], [address] [place], [place] [address] [phone]

## 2021-09-17 NOTE — Addendum Note (Signed)
Addended by: Huston Foley on: 09/17/2021 04:25 PM   Modules accepted: Orders

## 2021-09-21 ENCOUNTER — Telehealth: Payer: Self-pay | Admitting: *Deleted

## 2021-09-21 ENCOUNTER — Encounter: Payer: Self-pay | Admitting: *Deleted

## 2021-09-21 ENCOUNTER — Telehealth: Payer: Self-pay

## 2021-09-21 NOTE — Telephone Encounter (Signed)
-----   Message from Saima Athar, MD sent at 09/17/2021  4:25 PM EDT ----- Urgent set up requested. Patient referred by Dr. Luking, seen by me on 08/17/2021, patient had a HST on 09/16/2021.    Please call and notify the patient that the recent home sleep test showed obstructive sleep apnea in the severe range. I recommend treatment for this in the form of autoPAP, which means, that we don't have to bring him in for a sleep study with CPAP, but will let him start using a so called autoPAP machine at home, through a DME company (of his choice, or as per insurance requirement). The DME representative will fit the patient with a mask of choice, educate him on how to use the machine, how to put the mask on, etc. I have placed an order in the chart. Please send the order to a local DME, talk to patient, send report to referring MD. Please also reinforce the need for compliance with treatment. We will need a FU in sleep clinic for 10 weeks post-PAP set up, please arrange that with me or one of our NPs. Thanks,   Saima Athar, MD, PhD [facility] ([facility])     

## 2021-09-21 NOTE — Telephone Encounter (Signed)
-----   Message from Huston Foley, MD sent at 09/17/2021  4:25 PM EDT ----- Urgent set up requested. Patient referred by Dr. Gerda Diss, seen by me on 08/17/2021, patient had a HST on 09/16/2021.    Please call and notify the patient that the recent home sleep test showed obstructive sleep apnea in the severe range. I recommend treatment for this in the form of autoPAP, which means, that we don't have to bring him in for a sleep study with CPAP, but will let him start using a so called autoPAP machine at home, through a DME company (of his choice, or as per insurance requirement). The DME representative will fit the patient with a mask of choice, educate him on how to use the machine, how to put the mask on, etc. I have placed an order in the chart. Please send the order to a local DME, talk to patient, send report to referring MD. Please also reinforce the need for compliance with treatment. We will need a FU in sleep clinic for 10 weeks post-PAP set up, please arrange that with me or one of our NPs. Thanks,   Huston Foley, MD, PhD [facility])

## 2021-09-21 NOTE — Telephone Encounter (Signed)
I called pt. I advised pt that Dr. Frances Furbish reviewed their sleep study results and found that pt had severe OSA. Dr. Frances Furbish recommends that pt start autopap. I reviewed PAP compliance expectations with the pt. Pt is agreeable to starting an auto-PAP. I advised pt that an order will be sent to a DME, [facility], and they will call the pt within about one week after they file with the pt's insurance. [facility] will show the pt how to use the machine, fit for masks, and troubleshoot the auto-PAP if needed. A follow up appt was made for insurance purposes with Dr. Frances Furbish on 11-30-2021 at 1315. Pt verbalized understanding to arrive 15 minutes early and bring their auto-PAP. A letter with all of this information in it will be mailed to the pt as a reminder. I verified with the pt that the address we have on file is correct. Pt verbalized understanding of results. Pt had no questions at this time but was encouraged to call back if questions arise. I have sent the order to [facility] and have received confirmation that they have received the order. Letter sent to pt.

## 2021-09-21 NOTE — Telephone Encounter (Signed)
Attempted to reach the pt to review sleep study. Pt was not available and vm was full. Will attempt call back at a later time.

## 2021-10-30 ENCOUNTER — Ambulatory Visit: Payer: Managed Care, Other (non HMO) | Admitting: Family Medicine

## 2021-10-30 ENCOUNTER — Ambulatory Visit: Payer: Self-pay | Admitting: Family Medicine

## 2021-11-13 ENCOUNTER — Ambulatory Visit: Payer: Managed Care, Other (non HMO) | Admitting: Family Medicine

## 2021-11-13 ENCOUNTER — Encounter: Payer: Self-pay | Admitting: Family Medicine

## 2021-11-13 VITALS — BP 128/82 | Wt 179.8 lb

## 2021-11-13 DIAGNOSIS — E781 Pure hyperglyceridemia: Secondary | ICD-10-CM

## 2021-11-13 LAB — LIPID PANEL
Chol/HDL Ratio: 5.2 ratio — ABNORMAL HIGH (ref 0.0–5.0)
Cholesterol, Total: 244 mg/dL — ABNORMAL HIGH (ref 100–199)
HDL: 47 mg/dL (ref >39)
LDL Chol Calc (NIH): 89 mg/dL (ref 0–99)
Triglycerides: 661 mg/dL (ref 0–149)
VLDL Cholesterol Cal: 108 mg/dL — ABNORMAL HIGH (ref 5–40)

## 2021-11-13 LAB — CBC WITH DIFFERENTIAL/PLATELET
Basophils Absolute: 0.1 10*3/uL (ref 0.0–0.2)
Basos: 1 %
EOS (ABSOLUTE): 0.2 10*3/uL (ref 0.0–0.4)
Eos: 2 %
Hematocrit: 44.6 % (ref 37.5–51.0)
Hemoglobin: 15.4 g/dL (ref 13.0–17.7)
Immature Grans (Abs): 0.1 10*3/uL (ref 0.0–0.1)
Immature Granulocytes: 1 %
Lymphocytes Absolute: 3.4 10*3/uL — ABNORMAL HIGH (ref 0.7–3.1)
Lymphs: 32 %
MCH: 30.2 pg (ref 26.6–33.0)
MCHC: 34.5 g/dL (ref 31.5–35.7)
MCV: 88 fL (ref 79–97)
Monocytes Absolute: 1.3 10*3/uL — ABNORMAL HIGH (ref 0.1–0.9)
Monocytes: 13 %
Neutrophils Absolute: 5.4 10*3/uL (ref 1.4–7.0)
Neutrophils: 51 %
Platelets: 303 10*3/uL (ref 150–450)
RBC: 5.1 x10E6/uL (ref 4.14–5.80)
RDW: 12.7 % (ref 11.6–15.4)
WBC: 10.5 10*3/uL (ref 3.4–10.8)

## 2021-11-13 LAB — HEMOGLOBIN A1C
Est. average glucose Bld gHb Est-mCnc: 111 mg/dL
Hgb A1c MFr Bld: 5.5 % (ref 4.8–5.6)

## 2021-11-13 LAB — HEPATIC FUNCTION PANEL
ALT: 46 IU/L — ABNORMAL HIGH (ref 0–44)
AST: 25 IU/L (ref 0–40)
Albumin: 4.6 g/dL (ref 4.1–5.1)
Alkaline Phosphatase: 103 IU/L (ref 44–121)
Bilirubin Total: <0.2 mg/dL (ref 0.0–1.2)
Bilirubin, Direct: 0.08 mg/dL (ref 0.00–0.40)
Total Protein: 7.1 g/dL (ref 6.0–8.5)

## 2021-11-13 LAB — GLUCOSE, RANDOM: Glucose: 93 mg/dL (ref 70–99)

## 2021-11-13 MED ORDER — FENOFIBRATE 145 MG PO TABS: 145.0000 mg | ORAL_TABLET | Freq: Every day | ORAL | 1 refills | Status: AC

## 2021-11-13 NOTE — Progress Notes (Signed)
   Subjective:    Patient ID: Cody [name], male    DOB: 01-15-1986, 36 y.o.   MRN: [id number]  HPI Pt arrives for follow up. Pt has been using CPAP and states he can see a difference when he uses it. Pt states sometime he only wears CPAP for a few hours because at times it feels that the mask makes it hard to breath; like he can not get enough air.  Patient does state he benefits from the CPAP bit.  Denies any major setbacks.  He does work a modified second shift does not get home till 2 or 2:30 in the morning and finally gets to bed a little after 3 AM will sleep with the CPAP approximately 4 to 6 hours then has to get up for period of time to help with family then takes a nap later in the day so its been a struggle but he is trying to do the best he can  He has tried to do better with eating but does admit to eating fast food fairly frequently with chicken nuggets and [place] fries in addition to this he is avoiding alcohol during the week but on the weekends he says he will have a few  Review of Systems     Objective:   Physical Exam  General-in no acute distress Eyes-no discharge Lungs-respiratory rate normal, CTA CV-no murmurs,RRR Extremities skin warm dry no edema Neuro grossly normal Behavior normal, alert       Assessment & Plan:  We reviewed over the labs Hypertriglyceridemia Recommend fenofibrate 145 mg daily Recheck lipid profile in 6 to 8 weeks Encouraged him to limit alcohol on the weekends to 2 beers or less Avoid it during the week Has fatty liver as well Also has sleep apnea recommend follow-up with them we will send them a message to see if they have suggestions regarding his mask As for dietary I printed off multiple printouts regarding hypertriglyceridemia healthier eating minimizing starches fitting and exercise keeping healthy weight and minimizing and avoiding alcohol. Patient was warned that elevated triglycerides increased risk of pancreatitis Patient  will do a follow-up visit in approximately 5 to 6 months sooner if any problems if he feels he is having any trouble with the medicine he will let us know

## 2021-11-26 ENCOUNTER — Telehealth: Payer: Self-pay | Admitting: *Deleted

## 2021-11-26 NOTE — Telephone Encounter (Signed)
I called pt and LMVM for him to return call about delaying appt for later time, so as to get more 4 hour minimum time every night when using cpap. (30 day consecutive) for insurance compliance.

## 2021-11-30 ENCOUNTER — Ambulatory Visit: Payer: Managed Care, Other (non HMO) | Admitting: Neurology

## 2021-11-30 ENCOUNTER — Encounter: Payer: Self-pay | Admitting: Neurology

## 2022-02-23 ENCOUNTER — Ambulatory Visit
Admission: EM | Admit: 2022-02-23 | Discharge: 2022-02-23 | Disposition: A | Discharge status: Home / Self Care | Payer: Managed Care, Other (non HMO) | Attending: Family Medicine | Admitting: Family Medicine

## 2022-02-23 DIAGNOSIS — R509 Fever, unspecified: Secondary | ICD-10-CM | POA: Diagnosis not present

## 2022-02-23 DIAGNOSIS — R062 Wheezing: Secondary | ICD-10-CM | POA: Diagnosis not present

## 2022-02-23 DIAGNOSIS — J069 Acute upper respiratory infection, unspecified: Secondary | ICD-10-CM | POA: Diagnosis not present

## 2022-02-23 MED ORDER — PROMETHAZINE-DM 6.25-15 MG/5ML PO SYRP: 5.0000 mL | ORAL_SOLUTION | Freq: Four times a day (QID) | ORAL | 0 refills | Status: AC | PRN

## 2022-02-23 MED ORDER — ALBUTEROL SULFATE HFA 108 (90 BASE) MCG/ACT IN AERS: 2.0000 | INHALATION_SPRAY | RESPIRATORY_TRACT | 0 refills | Status: AC | PRN

## 2022-02-23 MED ORDER — OSELTAMIVIR PHOSPHATE 75 MG PO CAPS: 75.0000 mg | ORAL_CAPSULE | Freq: Two times a day (BID) | ORAL | 0 refills | Status: AC

## 2022-02-23 NOTE — ED Triage Notes (Signed)
Pt reports fever, sinus pressure, body aches, and coughing x 1 day. Took dayquil and tylenol which gave some relief.

## 2022-02-23 NOTE — ED Provider Notes (Signed)
[facility]    CSN: [id number] Arrival date & time: 02/23/22  1211      History   Chief Complaint Chief Complaint  Patient presents with   Chills    HPI COLLIE WERNICK is a 36 y.o. male.   Presenting today with 1 day history of fever, body aches, chills, cough, nasal congestion, sinus pressure.  Denies chest pain, shortness of breath, abdominal pain, nausea vomiting or diarrhea.  Took a dose of DayQuil and Tylenol which helped with the symptoms but he ran out.  Numerous sick contacts with strep recently, no other known sick contacts.    Past Medical History:  Diagnosis Date   No pertinent past medical history     Patient Active Problem List   Diagnosis Date Noted   Hypertriglyceridemia 11/13/2021   Allergic rhinitis due to pollen 07/10/2021    Past Surgical History:  Procedure Laterality Date   LUMBAR LAMINECTOMY/DECOMPRESSION MICRODISCECTOMY  05/18/2011   Procedure: LUMBAR LAMINECTOMY/DECOMPRESSION MICRODISCECTOMY;  Surgeon: Clydene Fake, MD;  Location: [facility];  Service: Neurosurgery;  Laterality: Left;  Left Lumbar Five-Sacral One Laminectomy/Diskectomy       Home Medications    Prior to Admission medications   Medication Sig Start Date End Date Taking? Authorizing Provider  albuterol (VENTOLIN HFA) 108 (90 Base) MCG/ACT inhaler Inhale 2 puffs into the lungs every 4 (four) hours as needed for wheezing or shortness of breath. 02/23/22  Yes Particia Nearing, PA-C  oseltamivir (TAMIFLU) 75 MG capsule Take 1 capsule (75 mg total) by mouth every 12 (twelve) hours. 02/23/22  Yes Particia Nearing, PA-C  promethazine-dextromethorphan (PROMETHAZINE-DM) 6.25-15 MG/5ML syrup Take 5 mLs by mouth 4 (four) times daily as needed. 02/23/22  Yes Particia Nearing, PA-C  fenofibrate (TRICOR) 145 MG tablet Take 1 tablet (145 mg total) by mouth daily. 11/13/21   Babs Sciara, MD    Family History Family History  Problem Relation Age of Onset    Diabetes Mother    Diabetes Maternal Grandmother    Breast cancer Maternal Grandmother     Social History Social History   Tobacco Use   Smoking status: Never   Smokeless tobacco: Current    Types: Chew  Vaping Use   Vaping Use: Never used  Substance Use Topics   Alcohol use: Yes    Comment: occasionally   Drug use: No     Allergies   Bee venom   Review of Systems Review of Systems HPI  Physical Exam Triage Vital Signs ED Triage Vitals  Enc Vitals Group     BP 02/23/22 1547 (!) 141/85     Pulse Rate 02/23/22 1547 92     Resp 02/23/22 1547 20     Temp 02/23/22 1547 99.3 F (37.4 C)     Temp Source 02/23/22 1547 Oral     SpO2 02/23/22 1547 97 %     Weight --      Height --      Head Circumference --      Peak Flow --      Pain Score 02/23/22 1548 5     Pain Loc --      Pain Edu? --      Excl. in GC? --    No data found.  Updated Vital Signs BP (!) 141/85 (BP Location: Right Arm)   Pulse 92   Temp 99.3 F (37.4 C) (Oral)   Resp 20   SpO2 97%   Visual Acuity  Right Eye Distance:   Left Eye Distance:   Bilateral Distance:    Right Eye Near:   Left Eye Near:    Bilateral Near:     Physical Exam Vitals and nursing note reviewed.  Constitutional:      Appearance: He is well-developed.  HENT:     Head: Atraumatic.     Right Ear: External ear normal.     Left Ear: External ear normal.     Nose: Rhinorrhea present.     Mouth/Throat:     Pharynx: Posterior oropharyngeal erythema present. No oropharyngeal exudate.  Eyes:     Conjunctiva/sclera: Conjunctivae normal.     Pupils: Pupils are equal, round, and reactive to light.  Cardiovascular:     Rate and Rhythm: Normal rate and regular rhythm.  Pulmonary:     Effort: Pulmonary effort is normal. No respiratory distress.     Breath sounds: Wheezing present. No rales.  Musculoskeletal:        General: Normal range of motion.     Cervical back: Normal range of motion and neck supple.   Lymphadenopathy:     Cervical: No cervical adenopathy.  Skin:    General: Skin is warm and dry.  Neurological:     Mental Status: He is alert and oriented to person, place, and time.  Psychiatric:        Behavior: Behavior normal.      UC Treatments / Results  Labs (all labs ordered are listed, but only abnormal results are displayed) Labs Reviewed - No data to display  EKG   Radiology No results found.  Procedures Procedures (including critical care time)  Medications Ordered in UC Medications - No data to display  Initial Impression / Assessment and Plan / UC Course  I have reviewed the triage vital signs and the nursing notes.  Pertinent labs & imaging results that were available during my care of the patient were reviewed by me and considered in my medical decision making (see chart for details).     Suspicion for influenza leading to some wheezing.  Treat with Tamiflu, Phenergan DM, albuterol inhaler, supportive over-the-counter medications and home care.  Will forego testing today due to the backorder on flu test.  Return for worsening symptoms.  Final Clinical Impressions(s) / UC Diagnoses   Final diagnoses:  Viral URI with cough  Fever, unspecified  Wheezing   Discharge Instructions   None    ED Prescriptions     Medication Sig Dispense Auth. Provider   oseltamivir (TAMIFLU) 75 MG capsule Take 1 capsule (75 mg total) by mouth every 12 (twelve) hours. 10 capsule Particia Nearing, [place]   promethazine-dextromethorphan (PROMETHAZINE-DM) 6.25-15 MG/5ML syrup Take 5 mLs by mouth 4 (four) times daily as needed. 100 mL Particia Nearing, PA-C   albuterol (VENTOLIN HFA) 108 (90 Base) MCG/ACT inhaler Inhale 2 puffs into the lungs every 4 (four) hours as needed for wheezing or shortness of breath. 18 g Particia Nearing, [place]      PDMP not reviewed this encounter.   Particia Nearing, [place] 02/23/22 1626

## 2022-05-14 ENCOUNTER — Ambulatory Visit: Payer: Managed Care, Other (non HMO) | Admitting: Family Medicine

## 2023-06-13 ENCOUNTER — Ambulatory Visit: Admitting: Family Medicine

## 2023-06-13 VITALS — BP 150/95 | HR 94 | Temp 98.8°F | Ht 68.0 in | Wt 162.0 lb

## 2023-06-13 DIAGNOSIS — J988 Other specified respiratory disorders: Secondary | ICD-10-CM | POA: Diagnosis not present

## 2023-06-13 MED ORDER — AMOXICILLIN-POT CLAVULANATE 875-125 MG PO TABS: 1.0000 | ORAL_TABLET | Freq: Two times a day (BID) | ORAL | 0 refills | Status: AC

## 2023-06-13 NOTE — Assessment & Plan Note (Signed)
 Treating with Augmentin.

## 2023-06-13 NOTE — Progress Notes (Signed)
 Subjective:  Patient ID: Cody [name], male    DOB: 09/14/1985  Age: 38 y.o. MRN: [id number]  CC:   Chief Complaint  Patient presents with   nasal , chest congestion, sob , weak, body aches    HPI:  38 year old male presents for evaluation of the above.  Patient reports that he has been sick for the past few days.  He reports congestion, body aches, generalized weakness, postnasal drip, cough, shortness of breath.  He states that this typically happens to him once or twice a year.  His daughter has recently been sick.  No documented fever.  Patient states that he does not feel like he is able to work today.  No relieving factors.  No other complaints.  Patient Active Problem List   Diagnosis Date Noted   Respiratory infection 06/13/2023   Hypertriglyceridemia 11/13/2021   Allergic rhinitis due to pollen 07/10/2021    Social Hx   Social History   Socioeconomic History   Marital status: Married    Spouse name: Not on file   Number of children: Not on file   Years of education: Not on file   Highest education level: Not on file  Occupational History   Not on file  Tobacco Use   Smoking status: Never   Smokeless tobacco: Current    Types: Chew  Vaping Use   Vaping status: Never Used  Substance and Sexual Activity   Alcohol use: Yes    Comment: occasionally   Drug use: No   Sexual activity: Not on file  Other Topics Concern   Not on file  Social History Narrative   Caffeine 3-4 daily soda.     Education" some Chemical engineer, Trucking    Social Drivers of Corporate investment banker Strain: Not on file  Food Insecurity: Not on file  Transportation Needs: Not on file  Physical Activity: Not on file  Stress: Not on file  Social Connections: Not on file    Review of Systems Per HPI  Objective:  BP (!) 150/95   Pulse 94   Temp 98.8 F (37.1 C)   Ht 5\' 8"  (1.727 m)   Wt 162 lb (73.5 kg)   SpO2 97%   BMI 24.63 kg/m      06/13/2023   11:23 AM  02/23/2022    3:47 PM 11/13/2021    8:47 AM  BP/Weight  Systolic BP 150 141 128  Diastolic BP 95 85 82  Wt. (Lbs) 162  179.8  BMI 24.63 kg/m2  27.34 kg/m2    Physical Exam Constitutional:      General: He is not in acute distress.    Appearance: Normal appearance.  HENT:     Head: Normocephalic and atraumatic.     Right Ear: Tympanic membrane normal.     Left Ear: Tympanic membrane normal.     Mouth/Throat:     Pharynx: Posterior oropharyngeal erythema present.  Eyes:     General:        Right eye: No discharge.        Left eye: No discharge.     Conjunctiva/sclera: Conjunctivae normal.  Cardiovascular:     Rate and Rhythm: Normal rate and regular rhythm.  Pulmonary:     Effort: Pulmonary effort is normal.     Breath sounds: Normal breath sounds. No wheezing or rales.  Neurological:     Mental Status: He is alert.  Psychiatric:  Mood and Affect: Mood normal.        Behavior: Behavior normal.     Lab Results  Component Value Date   WBC 10.5 11/12/2021   HGB 15.4 11/12/2021   HCT 44.6 11/12/2021   PLT 303 11/12/2021   GLUCOSE 93 11/12/2021   CHOL 244 (H) 11/12/2021   TRIG 661 (HH) 11/12/2021   HDL 47 11/12/2021   LDLCALC 89 11/12/2021   ALT 46 (H) 11/12/2021   AST 25 11/12/2021   NA 138 04/24/2021   K 4.3 04/24/2021   CL 98 04/24/2021   CREATININE 0.74 (L) 04/24/2021   BUN 7 04/24/2021   CO2 24 04/24/2021   INR 1.00 05/18/2011   HGBA1C 5.5 11/12/2021     Assessment & Plan:  Respiratory infection Assessment & Plan: Treating with Augmentin.   Other orders -     Amoxicillin-Pot Clavulanate; Take 1 tablet by mouth 2 (two) times daily.  Dispense: 14 tablet; Refill: 0    Follow-up:  Return if symptoms worsen or fail to improve.  Kathleen Papa DO [facility]

## 2023-06-13 NOTE — Patient Instructions (Signed)
 Rest. Fluids.  Medication as prescribed.   Take care  Dr. Adriana Simas

## 2023-08-18 IMAGING — US US ABDOMEN LIMITED
1 series · 14 of 25 positions shown · non-contrast
Comparison: None Available.

CLINICAL DATA: Elevated LFTs

EXAM:
ULTRASOUND ABDOMEN LIMITED RIGHT UPPER QUADRANT

[Series 1: us abdomen limited ruq (liver/gb) · 14 of 66 slices shown]
[im 1/66]
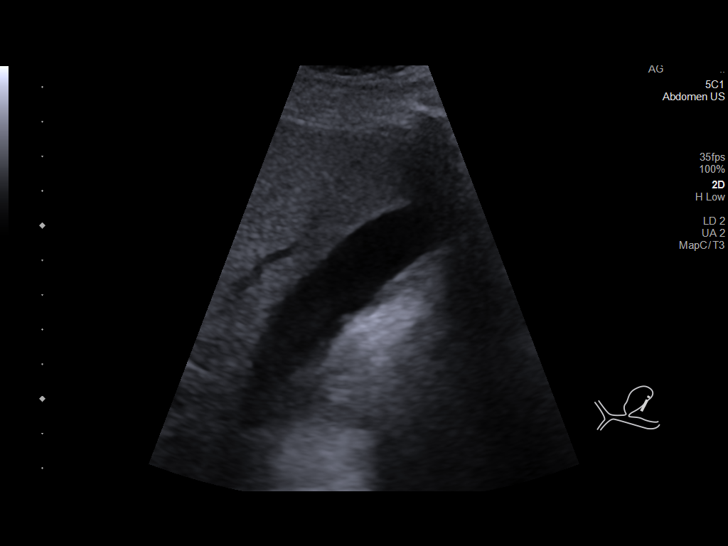
[im 6/66]
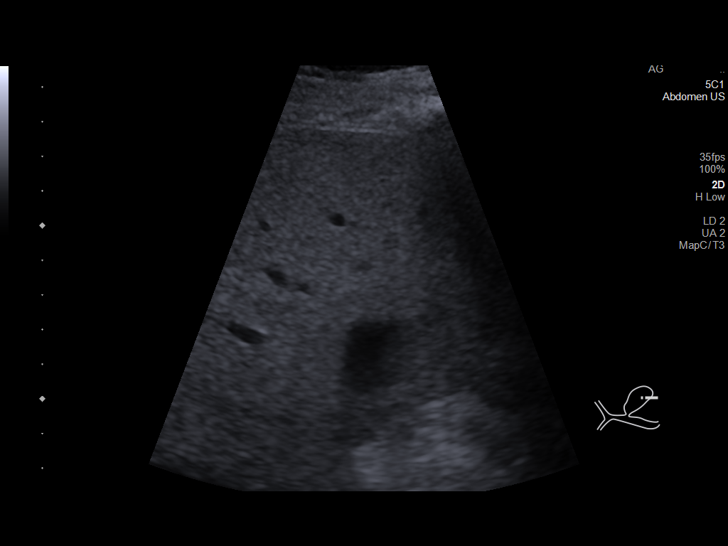
[im 11/66]
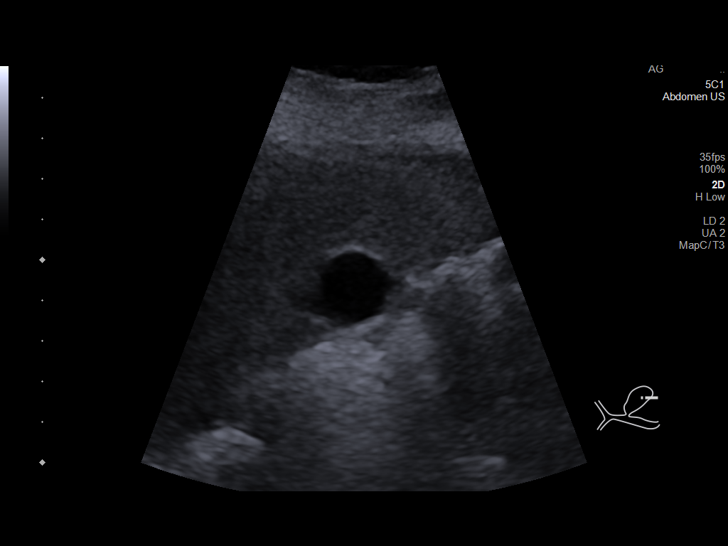
[im 17/66]
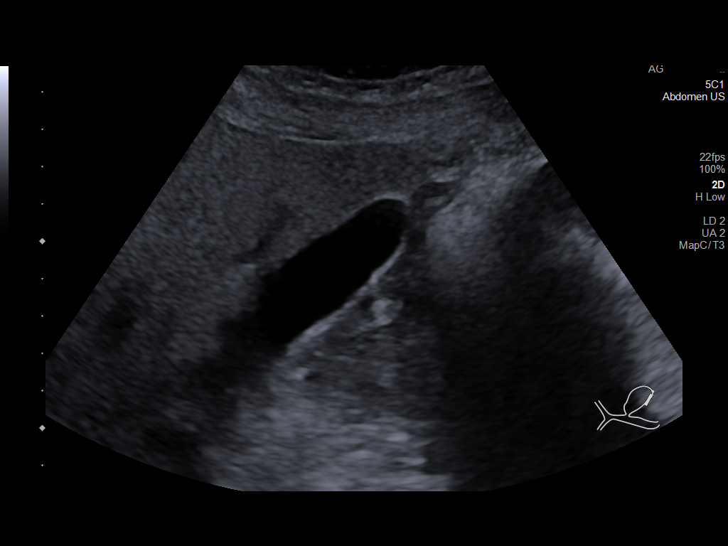
[im 22/66]
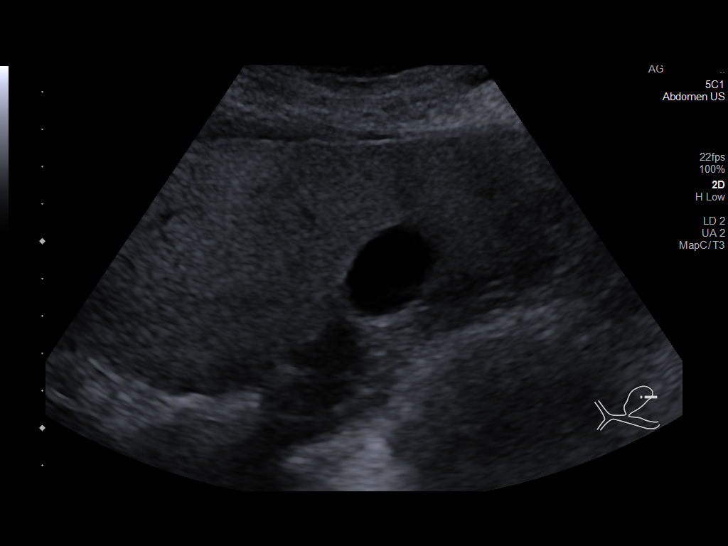
[im 25/66]
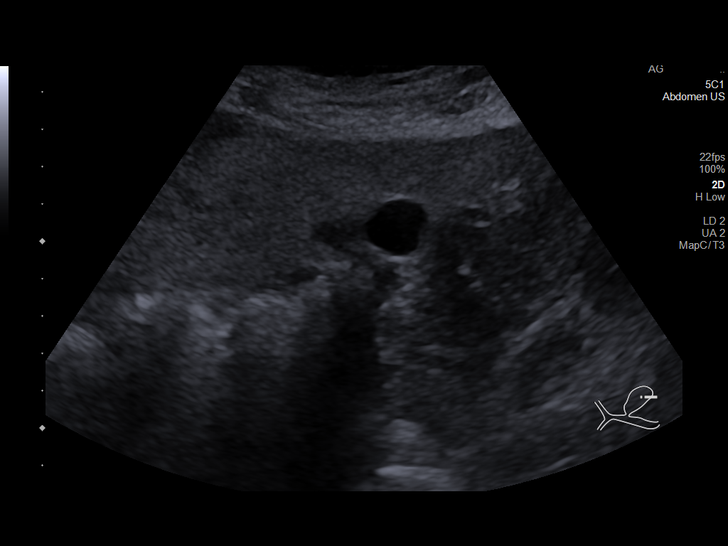
[im 30/66]
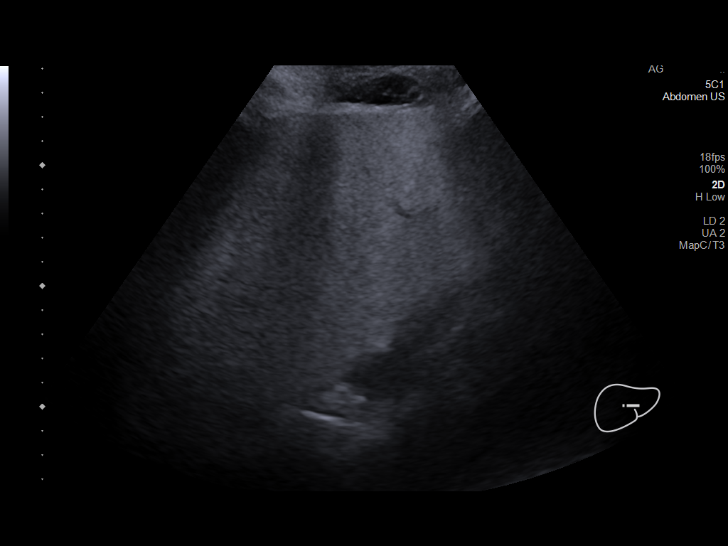
[im 36/66]
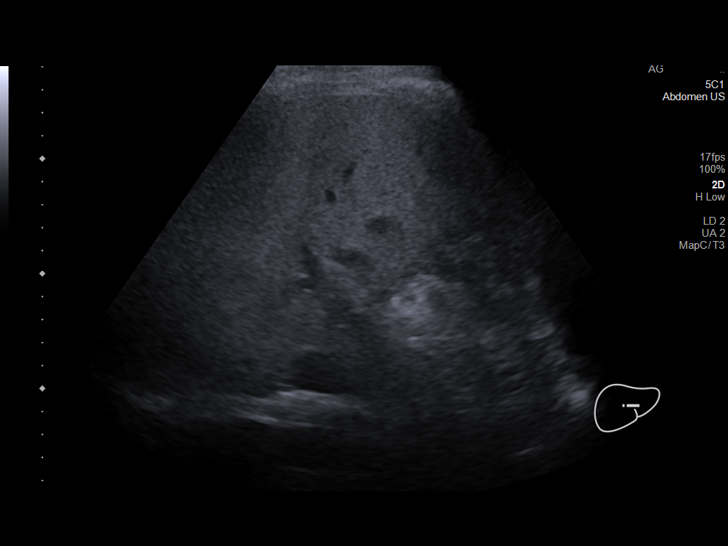
[im 41/66]
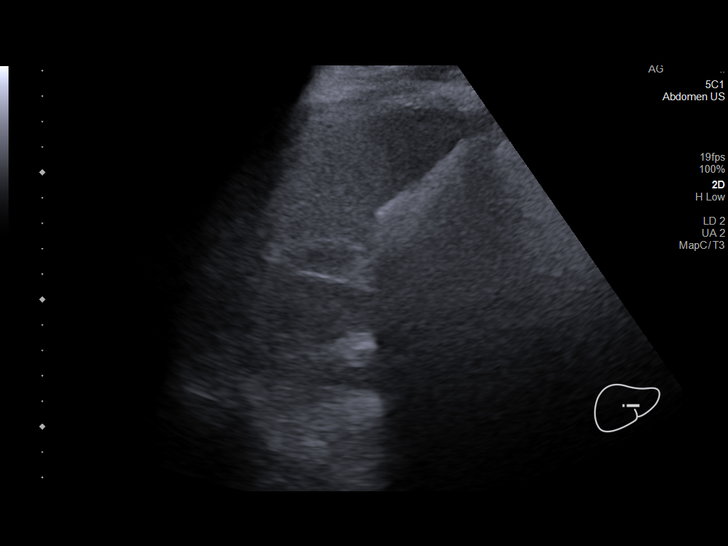
[im 44/66]
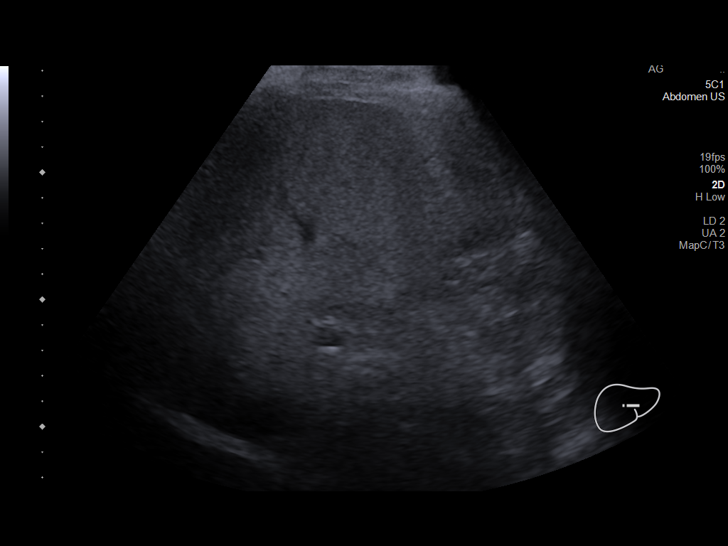
[im 49/66]
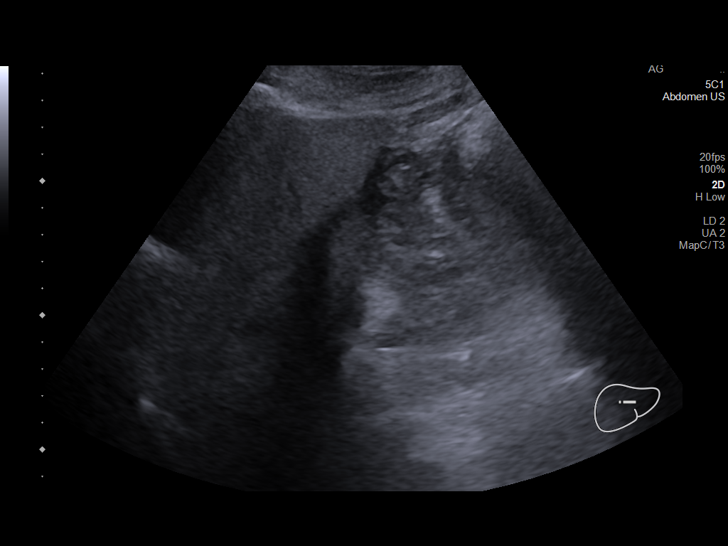
[im 55/66]
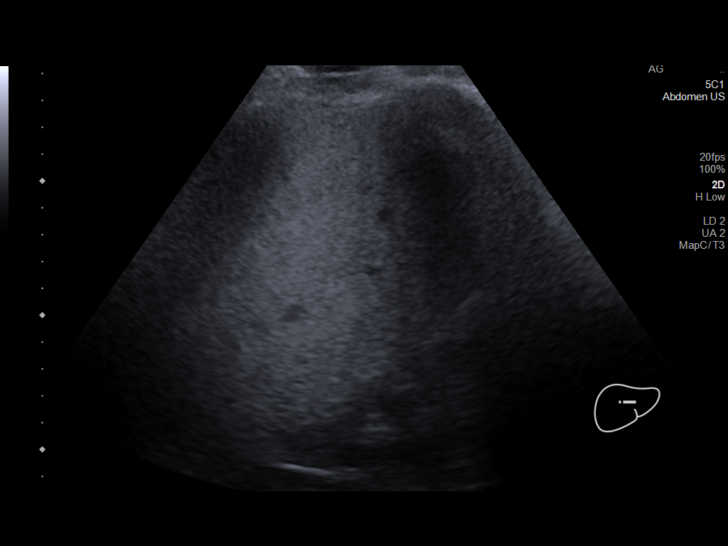
[im 60/66]
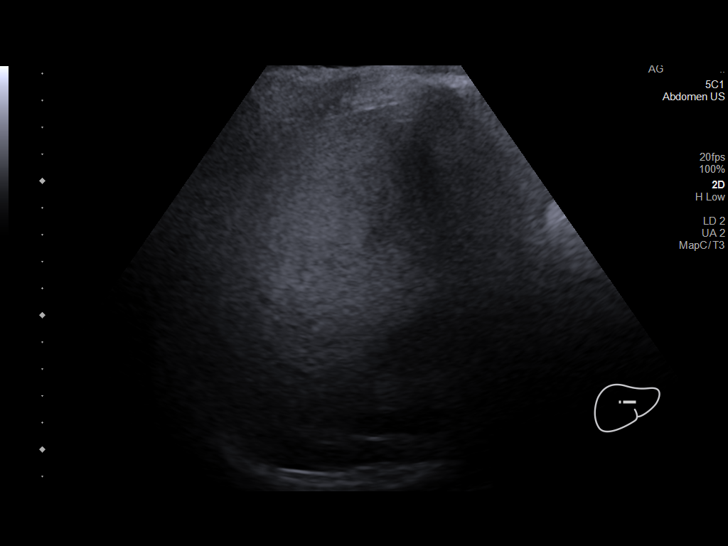
[im 66/66]
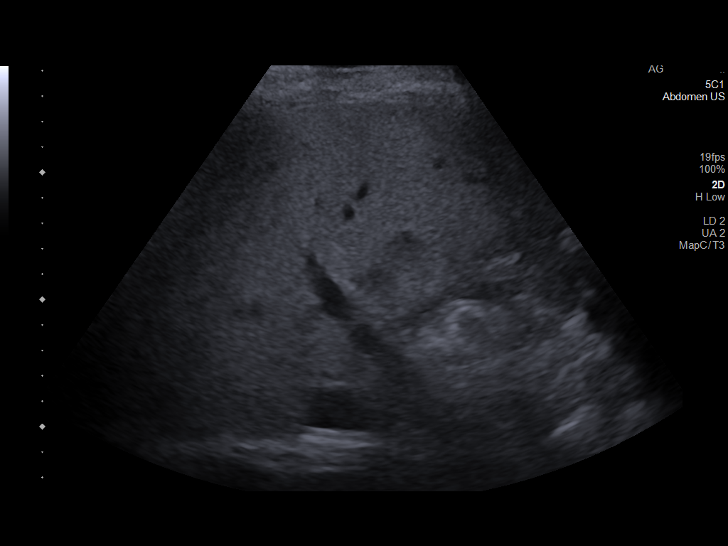

[14 of 25 positions shown; findings below may reference images not displayed]

FINDINGS: Gallbladder:

No gallstones or wall thickening visualized. No sonographic Murphy
sign noted by sonographer.

Common bile duct:

Diameter: 2 mm

Liver:

Increased echogenicity. No focal lesion. Portal vein is patent on
color Doppler imaging with normal direction of blood flow towards
the liver.

Other: None.
IMPRESSION: No cholelithiasis or sonographic evidence for acute cholecystitis.

Increased hepatic parenchymal echogenicity suggestive of steatosis.

## 2024-01-13 ENCOUNTER — Encounter: Admitting: Family Medicine
